# Patient Record
Sex: Female | Born: 1995 | Race: Black or African American | Hispanic: No | Marital: Single | Smoking: Never smoker
Health system: Southern US, Community
[De-identification: ages and names within clinical notes are randomized; demographics above are authoritative.]

## PROBLEM LIST (undated history)

## (undated) ENCOUNTER — Inpatient Hospital Stay (HOSPITAL_COMMUNITY): Payer: Self-pay

## (undated) DIAGNOSIS — D649 Anemia, unspecified: Secondary | ICD-10-CM

## (undated) DIAGNOSIS — J45909 Unspecified asthma, uncomplicated: Secondary | ICD-10-CM

## (undated) DIAGNOSIS — L709 Acne, unspecified: Secondary | ICD-10-CM

## (undated) HISTORY — PX: OTHER SURGICAL HISTORY: SHX169

## (undated) HISTORY — DX: Acne, unspecified: L70.9

---
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
## 2013-04-22 ENCOUNTER — Emergency Department (HOSPITAL_COMMUNITY)
Admission: EM | Admit: 2013-04-22 | Discharge: 2013-04-22 | Discharge status: Against Medical Advice | Payer: No Typology Code available for payment source | Attending: Emergency Medicine | Admitting: Emergency Medicine

## 2013-04-22 ENCOUNTER — Encounter (HOSPITAL_COMMUNITY): Payer: Self-pay | Admitting: Emergency Medicine

## 2013-04-22 DIAGNOSIS — R1084 Generalized abdominal pain: Secondary | ICD-10-CM | POA: Insufficient documentation

## 2013-04-22 DIAGNOSIS — R1110 Vomiting, unspecified: Secondary | ICD-10-CM | POA: Insufficient documentation

## 2013-04-22 DIAGNOSIS — J45909 Unspecified asthma, uncomplicated: Secondary | ICD-10-CM | POA: Insufficient documentation

## 2013-04-22 DIAGNOSIS — Z3202 Encounter for pregnancy test, result negative: Secondary | ICD-10-CM | POA: Insufficient documentation

## 2013-04-22 LAB — COMPREHENSIVE METABOLIC PANEL
ALBUMIN: 3.7 g/dL (ref 3.5–5.2)
ALK PHOS: 77 U/L (ref 39–117)
ALT: 22 U/L (ref 0–35)
AST: 29 U/L (ref 0–37)
BUN: 12 mg/dL (ref 6–23)
CHLORIDE: 102 meq/L (ref 96–112)
CO2: 25 mEq/L (ref 19–32)
Calcium: 9.4 mg/dL (ref 8.4–10.5)
Creatinine, Ser: 0.72 mg/dL (ref 0.50–1.10)
GFR calc Af Amer: >90 mL/min (ref >90)
GFR calc non Af Amer: >90 mL/min (ref >90)
GLUCOSE: 106 mg/dL — AB (ref 70–99)
POTASSIUM: 3.5 meq/L — AB (ref 3.7–5.3)
SODIUM: 140 meq/L (ref 137–147)
TOTAL PROTEIN: 7.3 g/dL (ref 6.0–8.3)
Total Bilirubin: 0.2 mg/dL — ABNORMAL LOW (ref 0.3–1.2)

## 2013-04-22 LAB — URINALYSIS, ROUTINE W REFLEX MICROSCOPIC
BILIRUBIN URINE: NEGATIVE
GLUCOSE, UA: NEGATIVE mg/dL
Ketones, ur: NEGATIVE mg/dL
Nitrite: NEGATIVE
PH: 6.5 (ref 5.0–8.0)
Protein, ur: NEGATIVE mg/dL
SPECIFIC GRAVITY, URINE: 1.026 (ref 1.005–1.030)
Urobilinogen, UA: 1 mg/dL (ref 0.0–1.0)

## 2013-04-22 LAB — CBC WITH DIFFERENTIAL/PLATELET
BASOS PCT: 0 % (ref 0–1)
Basophils Absolute: 0 10*3/uL (ref 0.0–0.1)
EOS ABS: 0.1 10*3/uL (ref 0.0–0.7)
Eosinophils Relative: 2 % (ref 0–5)
HEMATOCRIT: 37.6 % (ref 36.0–46.0)
HEMOGLOBIN: 12.8 g/dL (ref 12.0–15.0)
LYMPHS ABS: 2.7 10*3/uL (ref 0.7–4.0)
Lymphocytes Relative: 47 % — ABNORMAL HIGH (ref 12–46)
MCH: 28 pg (ref 26.0–34.0)
MCHC: 34 g/dL (ref 30.0–36.0)
MCV: 82.3 fL (ref 78.0–100.0)
Monocytes Absolute: 0.4 10*3/uL (ref 0.1–1.0)
Monocytes Relative: 8 % (ref 3–12)
NEUTROS ABS: 2.4 10*3/uL (ref 1.7–7.7)
NEUTROS PCT: 43 % (ref 43–77)
PLATELETS: 338 10*3/uL (ref 150–400)
RBC: 4.57 MIL/uL (ref 3.87–5.11)
RDW: 13 % (ref 11.5–15.5)
WBC: 5.6 10*3/uL (ref 4.0–10.5)

## 2013-04-22 LAB — URINE MICROSCOPIC-ADD ON

## 2013-04-22 LAB — POCT PREGNANCY, URINE: Preg Test, Ur: NEGATIVE

## 2013-04-22 LAB — LIPASE, BLOOD: Lipase: 24 U/L (ref 11–59)

## 2013-04-22 NOTE — ED Notes (Signed)
Pt left AMA °

## 2013-04-22 NOTE — ED Notes (Signed)
Pt states that she started having generalized abdominal pain and emesis 1 1/2 hours ago.

## 2013-07-08 ENCOUNTER — Encounter: Payer: Self-pay | Admitting: Obstetrics & Gynecology

## 2013-07-08 ENCOUNTER — Ambulatory Visit (INDEPENDENT_AMBULATORY_CARE_PROVIDER_SITE_OTHER): Payer: No Typology Code available for payment source | Admitting: Obstetrics & Gynecology

## 2013-07-08 ENCOUNTER — Telehealth: Payer: Self-pay | Admitting: Hematology and Oncology

## 2013-07-08 VITALS — BP 97/67 | HR 78 | Ht 62.0 in | Wt 170.0 lb

## 2013-07-08 DIAGNOSIS — Z975 Presence of (intrauterine) contraceptive device: Secondary | ICD-10-CM

## 2013-07-08 DIAGNOSIS — Z01812 Encounter for preprocedural laboratory examination: Secondary | ICD-10-CM

## 2013-07-08 DIAGNOSIS — Z30430 Encounter for insertion of intrauterine contraceptive device: Secondary | ICD-10-CM

## 2013-07-08 LAB — POCT URINE PREGNANCY: PREG TEST UR: NEGATIVE

## 2013-07-08 NOTE — Telephone Encounter (Signed)
C/D 07/08/13 for appt. 07/14/13

## 2013-07-08 NOTE — Progress Notes (Signed)
   Subjective:    Patient ID: Veronica [name], female    DOB: April 20, 1995, 18 y.o.   MRN: [id number]  HPI  18 yo S AA [id number] ( 728 month old daughter Corky CraftsSemoria) who is here today for contracetion. She is currently using condoms. She used the OCP for 1 month after her pp visit but she was forgetting to take her pills and doesn't want to use OCPs. She would like Mirena. She wants to delay childbearing for about 5-6 years.   Review of Systems     Objective:   Physical Exam  UPT negative, consent signed, Time out procedure done. Cervix prepped with betadine and grasped with a single tooth tenaculum. Mirena was easily placed and the strings were cut to 3-4 cm. Uterus sounded to 9 cm. She tolerated the procedure well.        Assessment & Plan:  Contraception- Mirena RTC 4 weeks for string check

## 2013-07-29 ENCOUNTER — Ambulatory Visit: Payer: No Typology Code available for payment source | Admitting: Obstetrics & Gynecology

## 2013-07-29 DIAGNOSIS — Z30431 Encounter for routine checking of intrauterine contraceptive device: Secondary | ICD-10-CM

## 2013-09-11 ENCOUNTER — Ambulatory Visit: Payer: No Typology Code available for payment source | Admitting: Obstetrics & Gynecology

## 2014-02-16 ENCOUNTER — Encounter: Payer: Self-pay | Admitting: Obstetrics & Gynecology

## 2014-04-24 IMAGING — US US OB DETAIL+14 WK
2 series · 12 of 28 positions shown · non-contrast
Comparison: none

[Series 1: us ob detail +14 wk · 14 acquisitions, 2 frames shown (1 of 2)]
[im 4/14]
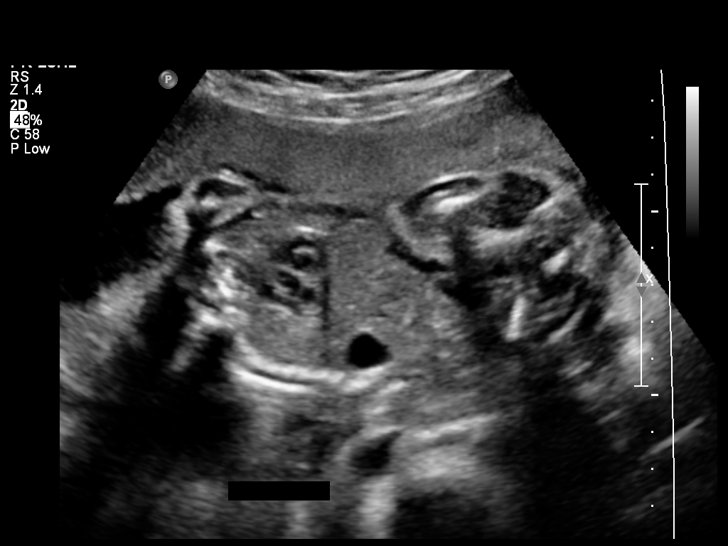
[im 10/14]
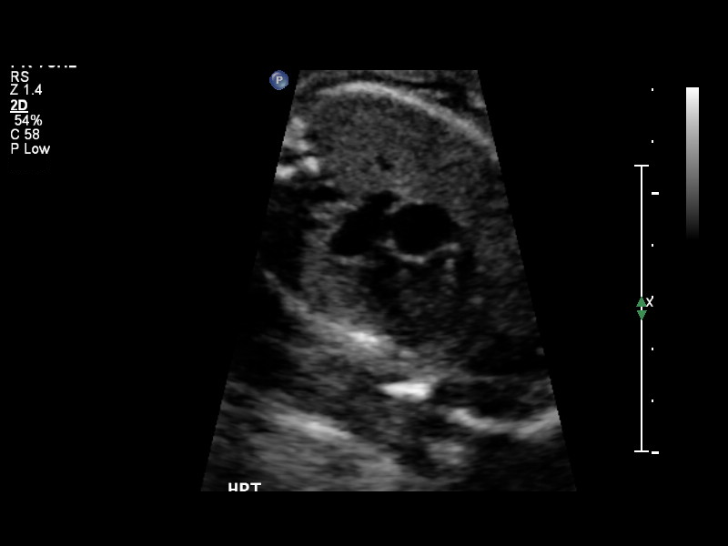

[Series 1: us ob detail +14 wk · 10 of 63 slices shown (2 of 2)]
[im 1/63]
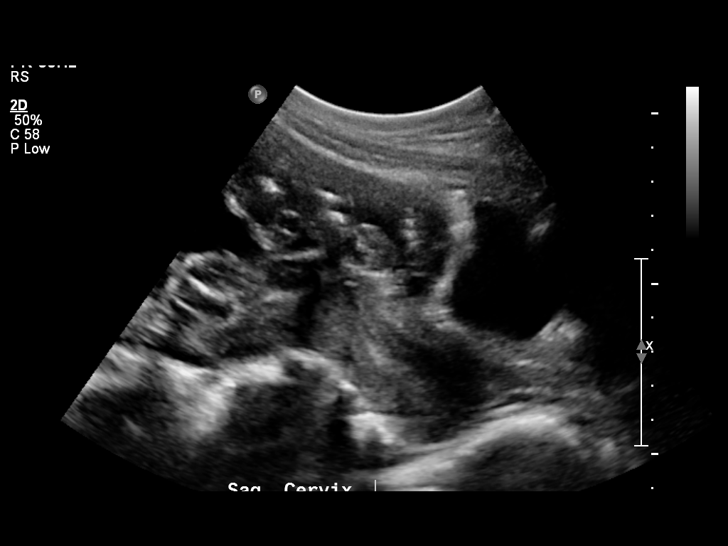
[im 9/63]
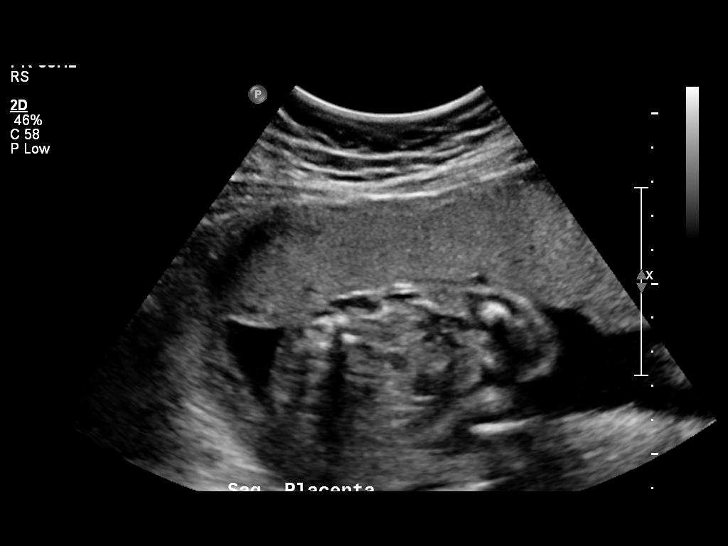
[im 15/63]
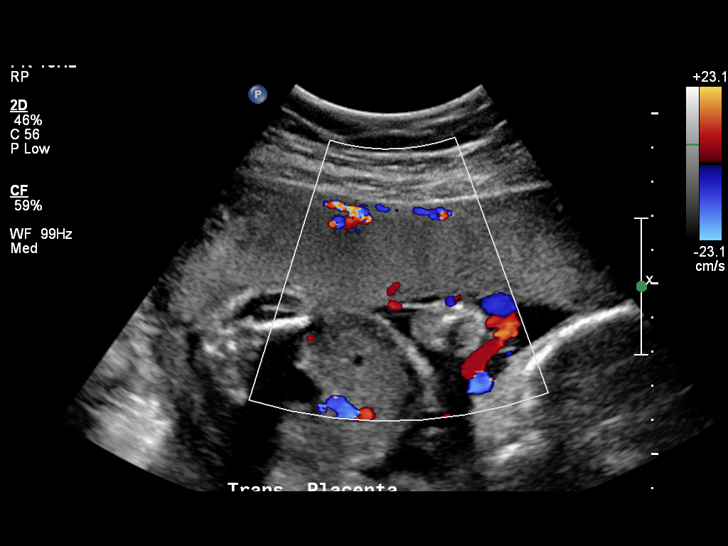
[im 20/63]
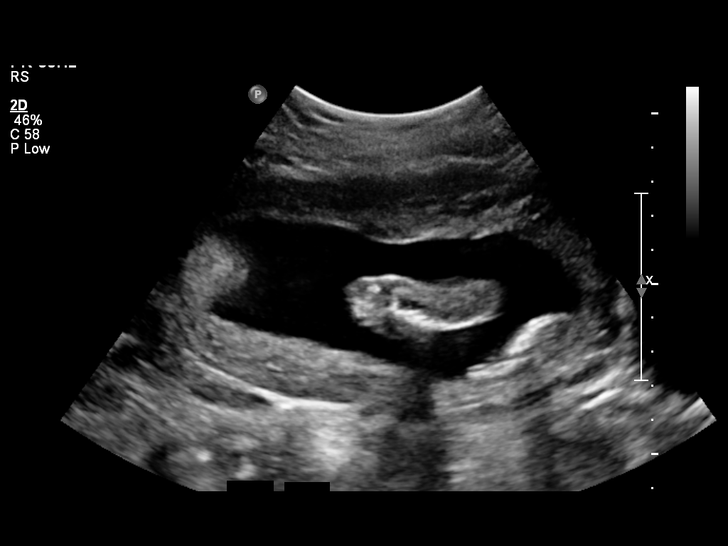
[im 29/63]
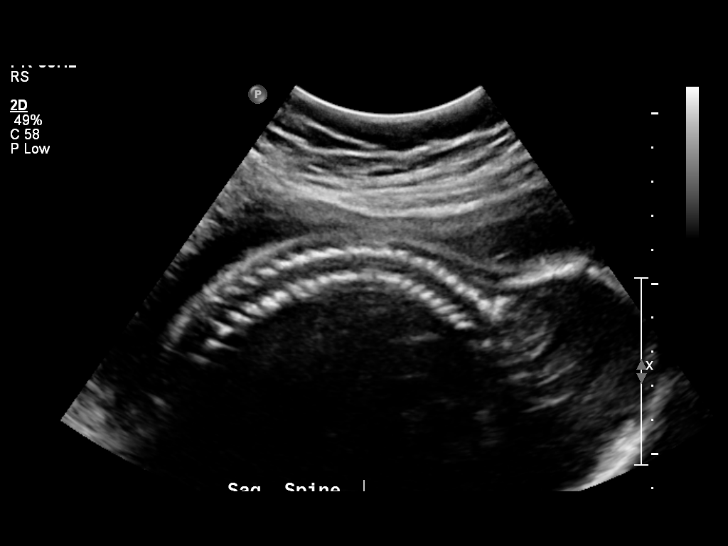
[im 34/63]
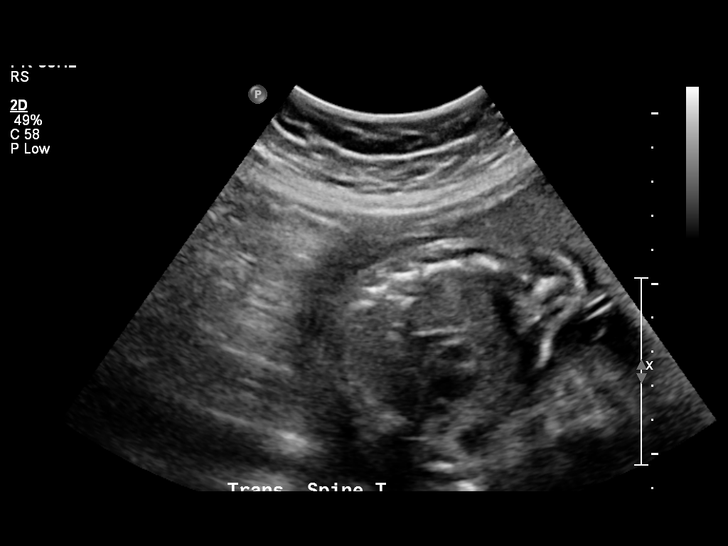
[im 40/63]
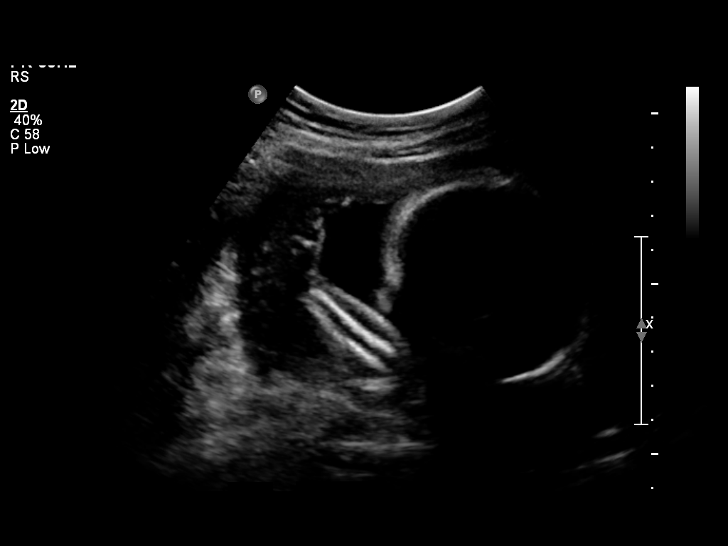
[im 48/63]
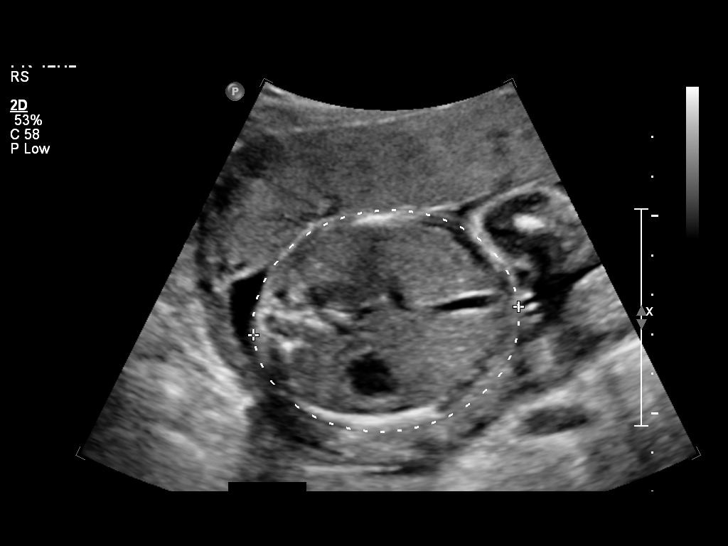
[im 54/63]
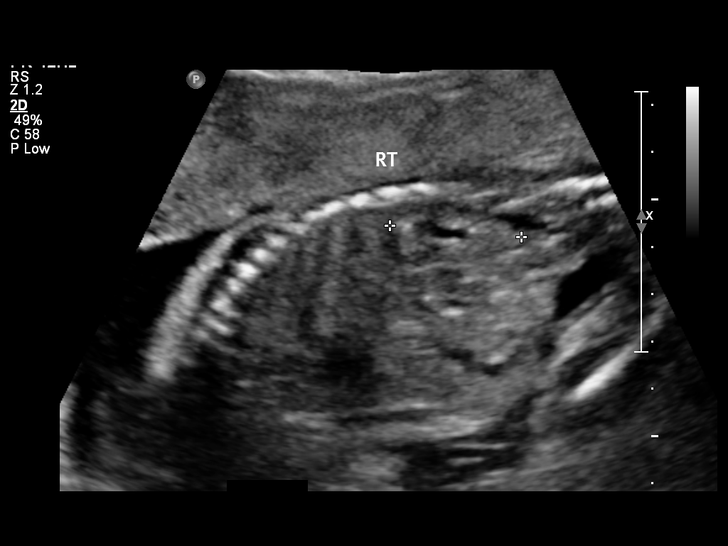
[im 60/63]
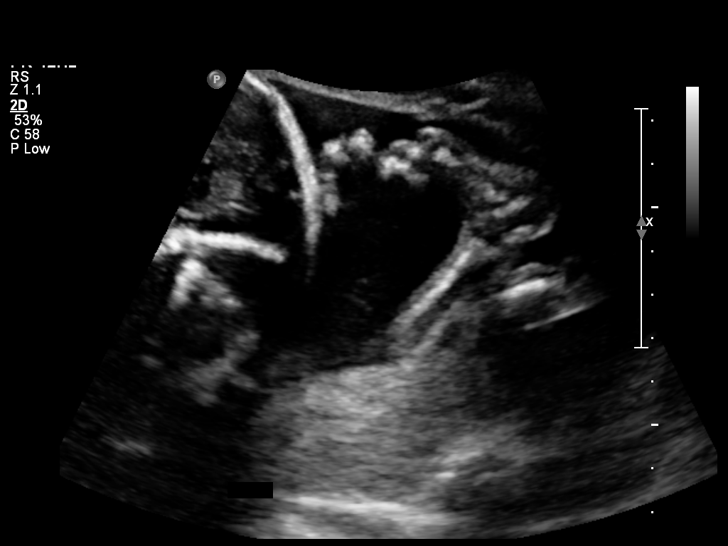

[12 of 28 positions shown; findings below may reference images not displayed]

OBSTETRICS REPORT
                      (Signed Final 07/17/2012 [DATE])

Service(s) Provided

 US OB DETAIL + 14 WK                                  76811.0
Indications

 Detailed fetal anatomic survey
 Unsure of LMP;  Establish Gestational [AGE]
Fetal Evaluation

 Num Of Fetuses:    1
 Fetal Heart Rate:  152                          bpm
 Cardiac Activity:  Observed
 Presentation:      Breech
 Placenta:          Anterior, above cervical os
 P. Cord            Visualized, central
 Insertion:

 Amniotic Fluid
 AFI FV:      Subjectively within normal limits
                                             Larg Pckt:     4.9  cm
Biometry

 BPD:       58  mm     G. Age:  23w 6d                CI:        68.34   70 - 86
                                                      FL/HC:      19.0   18.7 -

 HC:     224.3  mm     G. Age:  24w 3d       44  %    HC/AC:      1.15   1.05 -

 AC:       195  mm     G. Age:  24w 1d       43  %    FL/BPD:     73.6   71 - 87
 FL:      42.7  mm     G. Age:  24w 0d       32  %    FL/AC:      21.9   20 - 24
 HUM:     39.5  mm     G. Age:  24w 1d       41  %

 Est. FW:     661  gm      1 lb 7 oz     51  %
Gestational Age

 U/S Today:     24w 1d                                        EDD:   11/05/12
 Best:          24w 1d     Det. By:  U/S (07/17/12)           EDD:   11/05/12
2nd Trimester Genetic Sonogram - Trisomy 21 Screening

 Age:                                             17          Risk=1:   885

 Structural anomalies (inc. cardiac):             No
 Echogenic bowel:                                 No
 Hypoplastic / absent midphalanx 5th Digit:       No
 Pyelectasis:                                     No
 2-vessel umbilical cord:                         No
 Echogenic cardiac foci:                          No
Anatomy

 Cranium:          Appears normal         Aortic Arch:      Appears normal
 Fetal Cavum:      Appears normal         Ductal Arch:      Not well visualized
 Ventricles:       Appears normal         Diaphragm:        Appears normal
 Choroid Plexus:   Appears normal         Stomach:          Appears normal, left
                                                            sided
 Cerebellum:       Appears normal         Abdomen:          Appears normal
 Posterior Fossa:  Appears normal         Abdominal Wall:   Appears nml (cord
                                                            insert, abd wall)
 Nuchal Fold:      Not applicable (>20    Cord Vessels:     Appears normal (3
                   wks GA)                                  vessel cord)
 Face:             Appears normal         Kidneys:          Appear normal
                   (orbits and profile)
 Lips:             Appears normal         Bladder:          Appears normal
 Heart:            Appears normal         Spine:            Appears normal
                   (4CH, axis, and
                   situs)
 RVOT:             Not well visualized    Lower             Appears normal
                                          Extremities:
 LVOT:             Not well visualized    Upper             Appears normal
                                          Extremities:

 Other:  Female gender. Heels and 5th digit visualized.  Technically difficult
         due to fetal position.
Cervix Uterus Adnexa

 Cervical Length:    3.4      cm

 Cervix:       Normal appearance by transabdominal scan.
 Uterus:       No abnormality visualized.

 Left Ovary:    No adnexal mass visualized.
 Right Ovary:   No adnexal mass visualized.
 Adnexa:     No abnormality visualized.
Impression

 Single living IUP with US Gest. Age of 24w 1d, and EDD of
 11/05/2012.
 No fetal anomalies seen involving visualized anatomy.
 No sonographic markers for aneuploidy visualized.

## 2015-06-15 ENCOUNTER — Ambulatory Visit: Payer: No Typology Code available for payment source | Admitting: Family Medicine

## 2016-01-07 ENCOUNTER — Emergency Department (HOSPITAL_COMMUNITY)
Admission: EM | Admit: 2016-01-07 | Discharge: 2016-01-07 | Disposition: A | Discharge status: Home / Self Care | Payer: Medicaid Other | Attending: Emergency Medicine | Admitting: Emergency Medicine

## 2016-01-07 ENCOUNTER — Encounter (HOSPITAL_COMMUNITY): Payer: Self-pay

## 2016-01-07 ENCOUNTER — Emergency Department (HOSPITAL_COMMUNITY): Payer: Medicaid Other

## 2016-01-07 DIAGNOSIS — K2100 Gastro-esophageal reflux disease with esophagitis, without bleeding: Secondary | ICD-10-CM

## 2016-01-07 DIAGNOSIS — J45909 Unspecified asthma, uncomplicated: Secondary | ICD-10-CM | POA: Diagnosis not present

## 2016-01-07 DIAGNOSIS — K8020 Calculus of gallbladder without cholecystitis without obstruction: Secondary | ICD-10-CM | POA: Insufficient documentation

## 2016-01-07 DIAGNOSIS — R1013 Epigastric pain: Secondary | ICD-10-CM | POA: Diagnosis present

## 2016-01-07 DIAGNOSIS — R1011 Right upper quadrant pain: Secondary | ICD-10-CM

## 2016-01-07 LAB — LIPASE, BLOOD: LIPASE: 19 U/L (ref 11–51)

## 2016-01-07 LAB — CBC
HEMATOCRIT: 40.7 % (ref 36.0–46.0)
HEMOGLOBIN: 13.5 g/dL (ref 12.0–15.0)
MCH: 28.3 pg (ref 26.0–34.0)
MCHC: 33.2 g/dL (ref 30.0–36.0)
MCV: 85.3 fL (ref 78.0–100.0)
Platelets: 373 10*3/uL (ref 150–400)
RBC: 4.77 MIL/uL (ref 3.87–5.11)
RDW: 13.1 % (ref 11.5–15.5)
WBC: 10 10*3/uL (ref 4.0–10.5)

## 2016-01-07 LAB — URINALYSIS, ROUTINE W REFLEX MICROSCOPIC
Bilirubin Urine: NEGATIVE
GLUCOSE, UA: NEGATIVE mg/dL
Hgb urine dipstick: NEGATIVE
Ketones, ur: NEGATIVE mg/dL
LEUKOCYTES UA: NEGATIVE
NITRITE: NEGATIVE
PH: 8 (ref 5.0–8.0)
Protein, ur: NEGATIVE mg/dL
SPECIFIC GRAVITY, URINE: 1.02 (ref 1.005–1.030)

## 2016-01-07 LAB — COMPREHENSIVE METABOLIC PANEL
ALBUMIN: 4.5 g/dL (ref 3.5–5.0)
ALT: 84 U/L — ABNORMAL HIGH (ref 14–54)
ANION GAP: 9 (ref 5–15)
AST: 185 U/L — AB (ref 15–41)
Alkaline Phosphatase: 69 U/L (ref 38–126)
BUN: 7 mg/dL (ref 6–20)
CHLORIDE: 104 mmol/L (ref 101–111)
CO2: 26 mmol/L (ref 22–32)
Calcium: 9.5 mg/dL (ref 8.9–10.3)
Creatinine, Ser: 0.69 mg/dL (ref 0.44–1.00)
GFR calc Af Amer: >60 mL/min (ref >60)
Glucose, Bld: 101 mg/dL — ABNORMAL HIGH (ref 65–99)
POTASSIUM: 3.5 mmol/L (ref 3.5–5.1)
Sodium: 139 mmol/L (ref 135–145)
Total Bilirubin: 0.9 mg/dL (ref 0.3–1.2)
Total Protein: 7.8 g/dL (ref 6.5–8.1)

## 2016-01-07 LAB — I-STAT BETA HCG BLOOD, ED (MC, WL, AP ONLY): I-stat hCG, quantitative: <5 m[IU]/mL (ref <5)

## 2016-01-07 MED ORDER — GI COCKTAIL ~~LOC~~
30.0000 mL | Freq: Once | ORAL | Status: AC
  Administered 2016-01-07: 30 mL via ORAL
  Filled 2016-01-07: qty 30

## 2016-01-07 MED ORDER — OMEPRAZOLE 20 MG PO CPDR: 20.0000 mg | DELAYED_RELEASE_CAPSULE | Freq: Every day | ORAL | 0 refills | Status: DC

## 2016-01-07 NOTE — ED Notes (Signed)
Pt c/o abdominal pain, vomiting started at 2 am this morning, reports that she believes she has undiagnosed stomach ulcers that flare up from time to time, has a history of "reflux" states she took 2 doses of Zantac this am and has had no relief of her symptoms.

## 2016-01-07 NOTE — ED Triage Notes (Signed)
Pt with emesis and abdominal pain starting today.  Pt believes she has ulcers although she has never been diagnosed.  Pain is in left upper abdomen.  Pt denies diarrhea or fever.

## 2016-01-07 NOTE — ED Notes (Signed)
Verbalized understanding discharge instructions, prescription, and follow-up. In no acute distress.   

## 2016-01-08 NOTE — ED Provider Notes (Signed)
[facility]-EMERGENCY DEPT Provider Note   CSN: [id number] Arrival date & time: 01/07/16  1030     History   Chief Complaint Chief Complaint  Patient presents with  . Abdominal Pain  . Emesis    HPI Veronica [name] is a 20 y.o. female.  HPI 20 yo F who p/w epigastric and RUQ abdominal pain. Pt states that for the past year, she has had intermittent epigastric burning pain and RUQ pain with eating. Her current episode started three days ago after eating a spicy, fatty meal. She reports the pain as an epigastric, burning pain that is worse with lying flat and after eating spicy foods. She has minimal, intermittent RUQ pain that was present yesterday btu now resolved and she is tolerating PO. Has not taken any meds. She has a strong family h/o GERD and also drinks EtOH. No NSAID use or ASA use. No bloody emesis or melena. No fevers or chills.  Past Medical History:  Diagnosis Date  . Asthma    Childhood    Patient Active Problem List   Diagnosis Date Noted  . S/P cesarean section 11/10/2012  . Supervision of normal first pregnancy 09/04/2012  . BV (bacterial vaginosis) 07/12/2012  . Asthma complicating pregnancy, antepartum 07/11/2012    Past Surgical History:  Procedure Laterality Date  . CESAREAN SECTION N/A 11/10/2012   Procedure: Primary cesarean section with delivery of baby girl at 0731. Apgars 8/9.;  Surgeon: Tereso Newcomer, MD;  Location: [facility] ORS;  Service: Obstetrics;  Laterality: N/A;  . head surgery     Top of head epidermal but put to sleep    OB History    Gravida Para Term Preterm AB Living   1 1 1     1    SAB TAB Ectopic Multiple Live Births           1       Home Medications    Prior to Admission medications   Medication Sig Start Date End Date Taking? Authorizing Provider  albuterol (PROAIR HFA) 108 (90 Base) MCG/ACT inhaler Inhale 2 puffs into the lungs every 4 (four) hours as needed for wheezing or shortness of breath.   Yes Historical Provider, MD    ranitidine (ZANTAC) 150 MG tablet Take 150 mg by mouth 2 (two) times daily as needed for heartburn.    Yes Historical Provider, MD  omeprazole (PRILOSEC) 20 MG capsule Take 1 capsule (20 mg total) by mouth daily. In the morning, 30 minutes before food 01/07/16   Shaune Pollack, MD    Family History Family History  Problem Relation Age of Onset  . Hyperlipidemia Maternal Grandmother   . Diabetes Paternal Grandmother     Social History Social History  Substance Use Topics  . Smoking status: Never Smoker  . Smokeless tobacco: Never Used  . Alcohol use No     Allergies   Sulfa antibiotics   Review of Systems Review of Systems  Constitutional: Negative for chills and fever.  HENT: Negative for congestion, rhinorrhea and sore throat.   Eyes: Negative for visual disturbance.  Respiratory: Negative for cough, shortness of breath and wheezing.   Cardiovascular: Negative for chest pain and leg swelling.  Gastrointestinal: Positive for abdominal pain and nausea. Negative for diarrhea and vomiting.  Genitourinary: Negative for dysuria, flank pain, vaginal bleeding and vaginal discharge.  Musculoskeletal: Negative for neck pain.  Skin: Negative for rash.  Allergic/Immunologic: Negative for immunocompromised state.  Neurological: Negative for syncope and headaches.  Hematological:  Does not bruise/bleed easily.  All other systems reviewed and are negative.    Physical Exam Updated Vital Signs BP 114/71 (BP Location: Right Arm)   Pulse 66   Temp 98.8 F (37.1 C) (Oral)   Resp 16   SpO2 100%   Physical Exam  Constitutional: She is oriented to person, place, and time. She appears well-developed and well-nourished. No distress.  HENT:  Head: Normocephalic and atraumatic.  Eyes: Conjunctivae are normal.  Neck: Neck supple.  Cardiovascular: Normal rate, regular rhythm and normal heart sounds.  Exam reveals no friction rub.   No murmur heard. Pulmonary/Chest: Effort normal and  breath sounds normal. No respiratory distress. She has no wheezes. She has no rales.  Abdominal: Soft. Normal appearance and bowel sounds are normal. She exhibits no distension. There is tenderness (minimal) in the epigastric area. There is no rigidity, no rebound, no guarding, no CVA tenderness, no tenderness at McBurney's point and negative Murphy's sign.  Musculoskeletal: She exhibits no edema.  Neurological: She is alert and oriented to person, place, and time. She exhibits normal muscle tone.  Skin: Skin is warm. Capillary refill takes less than 2 seconds.  Psychiatric: She has a normal mood and affect.  Nursing note and vitals reviewed.    ED Treatments / Results  Labs (all labs ordered are listed, but only abnormal results are displayed) Labs Reviewed  COMPREHENSIVE METABOLIC PANEL - Abnormal; Notable for the following:       Result Value   Glucose, Bld 101 (*)    AST 185 (*)    ALT 84 (*)    All other components within normal limits  LIPASE, BLOOD  CBC  URINALYSIS, ROUTINE W REFLEX MICROSCOPIC (NOT AT [facility])  I-STAT BETA HCG BLOOD, ED ([facility], [facility], [facility] ONLY)    EKG  EKG Interpretation None       Radiology US Abdomen Limited Ruq  Result Date: 01/07/2016 CLINICAL DATA:  Right upper quadrant pain EXAM: US ABDOMEN LIMITED - RIGHT UPPER QUADRANT COMPARISON:  None. FINDINGS: Gallbladder: Multiple gallstones identified measuring up to 6 mm. No gallbladder wall thickness. No evidence for pericholecystic fluid. The sonographer reports no sonographic Murphy sign. Common bile duct: Diameter: Nondilated at 3-4 mm. Liver: No focal abnormality identified. Liver parenchyma appears slightly echogenic, raising the question of fatty deposition. IMPRESSION: Cholelithiasis without gallbladder wall thickening or pericholecystic fluid. Electronically Signed   By: Kennith Center M.D.   On: 01/07/2016 13:29    Procedures Procedures (including critical care time)  Medications Ordered in  ED Medications  gi cocktail (Maalox,Lidocaine,Donnatal) (30 mLs Oral Given 01/07/16 1232)     Initial Impression / Assessment and Plan / ED Course  I have reviewed the triage vital signs and the nursing notes.  Pertinent labs & imaging results that were available during my care of the patient were reviewed by me and considered in my medical decision making (see chart for details).  Clinical Course   20 yo F with no significant PMHx who p/w epigastric, burning abdominal pain worse with lying flat and improves with sitting upright. On arrival, VSS and WNL. Pain resolved with GI cocktail. Suspect GERD/gastritis with classic history. Biliary colic also on DDx though she has no RUQ pain at this time, tolerating PO, and no RUQ TTP, Murphy's fever, or signs of cholecystitis/impacted stone at this time - will check U/S, labs.  Labs overall are unremarkable. CBC shows no leukocytosis. CMP does show mild AST/ALT elevation but normal bilirubin - DDx fatty liver  disease, EtOH given 2:1 ratio. Normal bili, normal WBC, and U/S shows colic but no cholecystitis - doubt cholecystitis at this time and pain resolved after GI cocktail. Otherwise, Hgb wnl and doubt bleeding ulcer. Denies ASA, NSAID, as well as Tylenol use (given LFTs).  Discussed labs, findings with pt. Suspect GERD with possible symptomatic biliary colic. Will refer for surgery follow-up, trial NSAIDs. Discussed symptoms of cholecystitis with pt and encouraged return if sx worsen, persistent, or localize to RUQ. Pt ina greement. Will d/c home. Tolerating PO without pain at this time.  Final Clinical Impressions(s) / ED Diagnoses   Final diagnoses:  RUQ pain  Calculus of gallbladder without cholecystitis without obstruction  Gastroesophageal reflux disease with esophagitis    New Prescriptions Discharge Medication List as of 01/07/2016  1:42 PM    START taking these medications   Details  omeprazole (PRILOSEC) 20 MG capsule Take 1 capsule  (20 mg total) by mouth daily. In the morning, 30 minutes before food, Starting Fri 01/07/2016, Print         Shaune Pollackameron Lala Been, MD 01/08/16 1217

## 2016-12-26 ENCOUNTER — Ambulatory Visit: Payer: Self-pay | Admitting: Women's Health

## 2017-01-01 ENCOUNTER — Ambulatory Visit: Payer: Self-pay | Admitting: Women's Health

## 2017-01-16 ENCOUNTER — Ambulatory Visit: Payer: Self-pay | Admitting: Women's Health

## 2017-04-23 DIAGNOSIS — L02214 Cutaneous abscess of groin: Secondary | ICD-10-CM | POA: Diagnosis not present

## 2017-06-22 ENCOUNTER — Encounter: Payer: Self-pay | Admitting: Obstetrics & Gynecology

## 2017-06-22 ENCOUNTER — Ambulatory Visit (INDEPENDENT_AMBULATORY_CARE_PROVIDER_SITE_OTHER): Payer: Medicaid Other | Admitting: Obstetrics & Gynecology

## 2017-06-22 ENCOUNTER — Other Ambulatory Visit (HOSPITAL_COMMUNITY)
Admission: RE | Admit: 2017-06-22 | Discharge: 2017-06-22 | Disposition: A | Discharge status: Home / Self Care | Payer: Medicaid Other | Source: Ambulatory Visit | Attending: Obstetrics & Gynecology | Admitting: Obstetrics & Gynecology

## 2017-06-22 VITALS — BP 137/69 | HR 79 | Ht 62.0 in | Wt 167.0 lb

## 2017-06-22 DIAGNOSIS — Z23 Encounter for immunization: Secondary | ICD-10-CM

## 2017-06-22 DIAGNOSIS — Z0000 Encounter for general adult medical examination without abnormal findings: Secondary | ICD-10-CM

## 2017-06-22 DIAGNOSIS — Z01419 Encounter for gynecological examination (general) (routine) without abnormal findings: Secondary | ICD-10-CM | POA: Insufficient documentation

## 2017-06-22 NOTE — Progress Notes (Signed)
Subjective:    Veronica [name] is a 22 y.o. single G1 (24 yo daughter)female who presents for an annual exam. The patient is sexually active. GYN screening history: no prior history of gyn screening tests. The patient wears seatbelts: yes. The patient participates in regular exercise: yes. Has the patient ever been transfused or tattooed?: yes. The patient reports that there is not domestic violence in her life.   Menstrual History: OB History    Gravida Para Term Preterm AB Living   1 1 1     1    SAB TAB Ectopic Multiple Live Births           1      Menarche age: 22 No LMP recorded. Patient is not currently having periods (Reason: IUD).    The following portions of the patient's history were reviewed and updated as appropriate: allergies, current medications, past family history, past medical history, past social history, past surgical history and problem list.  Review of Systems Pertinent items are noted in HPI.   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"    Objective:    BP 137/69   Pulse 79   Ht 5\' 2"  (1.575 m)   Wt 167 lb (75.8 kg)   BMI 30.54 kg/m   General Appearance:    Alert, cooperative, no distress, appears stated age  Head:    Normocephalic, without obvious abnormality, atraumatic  Eyes:    PERRL, conjunctiva/corneas clear, EOM's intact, fundi    benign, both eyes  Ears:    Normal TM's and external ear canals, both ears  Nose:   Nares normal, septum midline, mucosa normal, no drainage    or sinus tenderness  Throat:   Lips, mucosa, and tongue normal; teeth and gums normal  Neck:   Supple, symmetrical, trachea midline, no adenopathy;    thyroid:  no enlargement/tenderness/nodules; no carotid   bruit or JVD  Back:     Symmetric, no curvature, ROM normal, no CVA tenderness  Lungs:     Clear to auscultation bilaterally, respirations unlabored  Chest Wall:    No tenderness  or deformity   Heart:    Regular rate and rhythm, S1 and S2 normal, no murmur, rub   or gallop  Breast Exam:    No tenderness, masses, or nipple abnormality, pierced nipples  Abdomen:     Soft, non-tender, bowel sounds active all four quadrants,    no masses, no organomegaly  Genitalia:    Normal female without lesion, discharge or tenderness, normal size and shape, anteverted, mobile, non-tender, normal adnexal exam      Extremities:   Extremities normal, atraumatic, no cyanosis or edema  Pulses:   2+ and symmetric all extremities  Skin:   Skin color, texture, turgor normal, no rashes or lesions  Lymph nodes:   Cervical, supraclavicular, and axillary nodes normal  Neurologic:   CNII-XII intact, normal strength, sensation and reflexes    throughout  .    Assessment:    Healthy female exam.    Plan:     Thin prep Pap smear.   STI testing Start Gardasil Refer to fam med

## 2017-06-22 NOTE — Progress Notes (Signed)
Pt desires IUD removal d/t it being in for 5 years. Pt had STI screening last month - does not need today.

## 2017-06-25 LAB — CYTOLOGY - PAP: Diagnosis: NEGATIVE

## 2017-08-22 ENCOUNTER — Ambulatory Visit: Payer: Medicaid Other

## 2017-08-24 ENCOUNTER — Ambulatory Visit (INDEPENDENT_AMBULATORY_CARE_PROVIDER_SITE_OTHER): Payer: Medicaid Other

## 2017-08-24 DIAGNOSIS — Z23 Encounter for immunization: Secondary | ICD-10-CM

## 2017-08-24 NOTE — Progress Notes (Signed)
Pt here for HPV inj. Inj given in left deltoid. Pt tolerated well. Pt advised to schedule appt for her last inj for 4 months from now.

## 2017-08-27 NOTE — Progress Notes (Signed)
I have reviewed the chart and agree with nursing staff's documentation of this patient's encounter.  Darah Simkin, MD 08/27/2017 2:02 PM    

## 2017-11-15 DIAGNOSIS — Z113 Encounter for screening for infections with a predominantly sexual mode of transmission: Secondary | ICD-10-CM | POA: Diagnosis not present

## 2017-11-15 DIAGNOSIS — N760 Acute vaginitis: Secondary | ICD-10-CM | POA: Diagnosis not present

## 2017-11-22 DIAGNOSIS — W57XXXA Bitten or stung by nonvenomous insect and other nonvenomous arthropods, initial encounter: Secondary | ICD-10-CM | POA: Diagnosis not present

## 2017-11-22 DIAGNOSIS — L0390 Cellulitis, unspecified: Secondary | ICD-10-CM | POA: Diagnosis not present

## 2018-01-02 DIAGNOSIS — Z23 Encounter for immunization: Secondary | ICD-10-CM | POA: Diagnosis not present

## 2018-02-15 ENCOUNTER — Emergency Department (HOSPITAL_COMMUNITY): Payer: Medicaid Other

## 2018-02-15 ENCOUNTER — Emergency Department (HOSPITAL_COMMUNITY)
Admission: EM | Admit: 2018-02-15 | Discharge: 2018-02-15 | Disposition: A | Discharge status: Home / Self Care | Payer: Medicaid Other | Attending: Emergency Medicine | Admitting: Emergency Medicine

## 2018-02-15 ENCOUNTER — Other Ambulatory Visit: Payer: Self-pay

## 2018-02-15 ENCOUNTER — Encounter (HOSPITAL_COMMUNITY): Payer: Self-pay | Admitting: Emergency Medicine

## 2018-02-15 DIAGNOSIS — R6883 Chills (without fever): Secondary | ICD-10-CM | POA: Diagnosis not present

## 2018-02-15 DIAGNOSIS — R55 Syncope and collapse: Secondary | ICD-10-CM | POA: Diagnosis not present

## 2018-02-15 DIAGNOSIS — K603 Anal fistula: Secondary | ICD-10-CM

## 2018-02-15 DIAGNOSIS — R110 Nausea: Secondary | ICD-10-CM | POA: Diagnosis not present

## 2018-02-15 DIAGNOSIS — K625 Hemorrhage of anus and rectum: Secondary | ICD-10-CM | POA: Diagnosis present

## 2018-02-15 HISTORY — DX: Anemia, unspecified: D64.9

## 2018-02-15 LAB — I-STAT CHEM 8, ED
BUN: 6 mg/dL (ref 6–20)
CALCIUM ION: 1.2 mmol/L (ref 1.15–1.40)
Chloride: 104 mmol/L (ref 98–111)
Creatinine, Ser: 0.7 mg/dL (ref 0.44–1.00)
GLUCOSE: 90 mg/dL (ref 70–99)
HCT: 41 % (ref 36.0–46.0)
HEMOGLOBIN: 13.9 g/dL (ref 12.0–15.0)
Potassium: 3.2 mmol/L — ABNORMAL LOW (ref 3.5–5.1)
SODIUM: 140 mmol/L (ref 135–145)
TCO2: 24 mmol/L (ref 22–32)

## 2018-02-15 LAB — BASIC METABOLIC PANEL
Anion gap: 9 (ref 5–15)
BUN: 8 mg/dL (ref 6–20)
CALCIUM: 9.3 mg/dL (ref 8.9–10.3)
CHLORIDE: 104 mmol/L (ref 98–111)
CO2: 24 mmol/L (ref 22–32)
CREATININE: 0.71 mg/dL (ref 0.44–1.00)
GFR calc Af Amer: >60 mL/min (ref >60)
GFR calc non Af Amer: >60 mL/min (ref >60)
Glucose, Bld: 92 mg/dL (ref 70–99)
Potassium: 3.1 mmol/L — ABNORMAL LOW (ref 3.5–5.1)
SODIUM: 137 mmol/L (ref 135–145)

## 2018-02-15 LAB — CBC WITH DIFFERENTIAL/PLATELET
Abs Immature Granulocytes: 0.06 10*3/uL (ref 0.00–0.07)
Basophils Absolute: 0 10*3/uL (ref 0.0–0.1)
Basophils Relative: 0 %
EOS ABS: 0 10*3/uL (ref 0.0–0.5)
EOS PCT: 0 %
HCT: 39.4 % (ref 36.0–46.0)
HEMOGLOBIN: 12.5 g/dL (ref 12.0–15.0)
Immature Granulocytes: 1 %
Lymphocytes Relative: 9 %
Lymphs Abs: 1.2 10*3/uL (ref 0.7–4.0)
MCH: 28.2 pg (ref 26.0–34.0)
MCHC: 31.7 g/dL (ref 30.0–36.0)
MCV: 88.7 fL (ref 80.0–100.0)
MONO ABS: 0.8 10*3/uL (ref 0.1–1.0)
Monocytes Relative: 6 %
NRBC: 0 % (ref 0.0–0.2)
Neutro Abs: 11 10*3/uL — ABNORMAL HIGH (ref 1.7–7.7)
Neutrophils Relative %: 84 %
Platelets: 338 10*3/uL (ref 150–400)
RBC: 4.44 MIL/uL (ref 3.87–5.11)
RDW: 13.2 % (ref 11.5–15.5)
WBC: 13.2 10*3/uL — AB (ref 4.0–10.5)

## 2018-02-15 LAB — TYPE AND SCREEN
ABO/RH(D): A POS
ANTIBODY SCREEN: NEGATIVE

## 2018-02-15 LAB — ABO/RH: ABO/RH(D): A POS

## 2018-02-15 LAB — I-STAT BETA HCG BLOOD, ED (MC, WL, AP ONLY)

## 2018-02-15 LAB — POC OCCULT BLOOD, ED: FECAL OCCULT BLD: POSITIVE — AB

## 2018-02-15 MED ORDER — CLINDAMYCIN HCL 300 MG PO CAPS
300.0000 mg | ORAL_CAPSULE | Freq: Once | ORAL | Status: AC
  Administered 2018-02-15: 300 mg via ORAL
  Filled 2018-02-15: qty 1

## 2018-02-15 MED ORDER — LIDOCAINE-EPINEPHRINE (PF) 2 %-1:200000 IJ SOLN
INTRAMUSCULAR | Status: AC
  Administered 2018-02-15: 20 mL via INTRADERMAL
  Filled 2018-02-15: qty 20

## 2018-02-15 MED ORDER — SODIUM CHLORIDE 0.9 % IJ SOLN
INTRAMUSCULAR | Status: AC
  Filled 2018-02-15: qty 50

## 2018-02-15 MED ORDER — HYDROCODONE-ACETAMINOPHEN 5-325 MG PO TABS: 2.0000 | ORAL_TABLET | Freq: Four times a day (QID) | ORAL | 0 refills | Status: DC | PRN

## 2018-02-15 MED ORDER — IOPAMIDOL (ISOVUE-300) INJECTION 61%
100.0000 mL | Freq: Once | INTRAVENOUS | Status: AC | PRN
  Administered 2018-02-15: 100 mL via INTRAVENOUS

## 2018-02-15 MED ORDER — FENTANYL CITRATE (PF) 100 MCG/2ML IJ SOLN: 50.0000 ug | Freq: Once | INTRAMUSCULAR | Status: DC

## 2018-02-15 MED ORDER — IOPAMIDOL (ISOVUE-300) INJECTION 61%
INTRAVENOUS | Status: AC
  Filled 2018-02-15: qty 100

## 2018-02-15 MED ORDER — ONDANSETRON 4 MG PO TBDP: 4.0000 mg | ORAL_TABLET | Freq: Four times a day (QID) | ORAL | 0 refills | Status: DC | PRN

## 2018-02-15 MED ORDER — PROBIOTIC PO CAPS: 1.0000 | ORAL_CAPSULE | Freq: Every day | ORAL | 1 refills | Status: DC

## 2018-02-15 MED ORDER — CLINDAMYCIN HCL 300 MG PO CAPS: 300.0000 mg | ORAL_CAPSULE | Freq: Three times a day (TID) | ORAL | 0 refills | Status: DC

## 2018-02-15 MED ORDER — FENTANYL CITRATE (PF) 100 MCG/2ML IJ SOLN
50.0000 ug | Freq: Once | INTRAMUSCULAR | Status: AC
  Administered 2018-02-15: 50 ug via INTRAVENOUS
  Filled 2018-02-15: qty 2

## 2018-02-15 MED ORDER — LIDOCAINE-EPINEPHRINE (PF) 2 %-1:200000 IJ SOLN
20.0000 mL | Freq: Once | INTRAMUSCULAR | Status: AC
  Administered 2018-02-15: 20 mL via INTRADERMAL

## 2018-02-15 MED ORDER — ONDANSETRON HCL 4 MG/2ML IJ SOLN
4.0000 mg | Freq: Once | INTRAMUSCULAR | Status: AC
  Administered 2018-02-15: 4 mg via INTRAVENOUS
  Filled 2018-02-15: qty 2

## 2018-02-15 NOTE — ED Provider Notes (Addendum)
TIME SEEN: 4:50 AM  CHIEF COMPLAINT: Rectal pain  HPI: Patient is a 22 year old female with history of anemia who presents to the emergency department with rectal pain that started tonight.  States she went to the bathroom and noticed purulent, bloody drainage.  States she started feeling very poorly and went to take a shower and had a near syncopal event.  She did strike her head on the shower door.  No loss of consciousness.  Did not fall to the ground.  Thinks that she may have a hemorrhoid.  She denies any known fevers but has had some chills and nausea.  No vomiting or diarrhea.  No chest pain or shortness of breath.  ROS: See HPI Constitutional: no fever  Eyes: no drainage  ENT: no runny nose   Cardiovascular:  no chest pain  Resp: no SOB  GI: no vomiting GU: no dysuria Integumentary: no rash  Allergy: no hives  Musculoskeletal: no leg swelling  Neurological: no slurred speech ROS otherwise negative  PAST MEDICAL HISTORY/PAST SURGICAL HISTORY:  Past Medical History:  Diagnosis Date  . Acne   . Anemia   . Asthma    Childhood    MEDICATIONS:  Prior to Admission medications   Medication Sig Start Date End Date Taking? Authorizing Provider  docusate sodium (COLACE) 100 MG capsule Take 200 mg by mouth daily.   Yes [provider]  ibuprofen (ADVIL,MOTRIN) 800 MG tablet Take 400 mg by mouth every 8 (eight) hours as needed for moderate pain.   Yes [provider]  levonorgestrel (MIRENA) 20 MCG/24HR IUD 1 each by Intrauterine route once.   Yes [provider]  phenylephrine-shark liver oil-mineral oil-petrolatum (PREPARATION H) 0.25-3-14-71.9 % rectal ointment Place 1 application rectally 2 (two) times daily as needed for hemorrhoids.   Yes [provider]    ALLERGIES:  Allergies  Allergen Reactions  . Sulfa Antibiotics Other (See Comments)    Jitters & shakes    SOCIAL HISTORY:  Social History   Tobacco Use  . Smoking status: Never  Smoker  . Smokeless tobacco: Never Used  Substance Use Topics  . Alcohol use: Yes    Comment: Socially    FAMILY HISTORY: Family History  Problem Relation Age of Onset  . Hyperlipidemia Maternal Grandmother   . Hypertension Maternal Grandmother   . Diabetes Paternal Grandmother   . Hypertension Mother   . Lung cancer Paternal Grandfather   . Cancer Paternal Uncle     EXAM: BP 136/84 (BP Location: Right Arm)   Pulse (!) 105   Temp 98.4 F (36.9 C) (Oral)   Ht 5\' 2"  (1.575 m)   Wt 70.8 kg   SpO2 100%   BMI 28.53 kg/m  CONSTITUTIONAL: Alert and oriented and responds appropriately to questions. Well-appearing; well-nourished HEAD: Normocephalic EYES: Conjunctivae clear, pupils appear equal, EOMI ENT: normal nose; moist mucous membranes NECK: Supple, no meningismus, no nuchal rigidity, no LAD  CARD: RRR; S1 and S2 appreciated; no murmurs, no clicks, no rubs, no gallops RESP: Normal chest excursion without splinting or tachypnea; breath sounds clear and equal bilaterally; no wheezes, no rhonchi, no rales, no hypoxia or respiratory distress, speaking full sentences ABD/GI: Normal bowel sounds; non-distended; soft, non-tender, no rebound, no guarding, no peritoneal signs, no hepatosplenomegaly BACK:  The back appears normal and is non-tender to palpation, there is no CVA tenderness RECTAL: No gross blood or melena, no external hemorrhoids on exam, patient does have an area that is draining purulent drainage from  the right inner buttock.  She has fluctuance and induration that extends over to the left buttock and inside of the rectum.  No subcutaneous emphysema noted. EXT: Normal ROM in all joints; non-tender to palpation; no edema; normal capillary refill; no cyanosis, no calf tenderness or swelling    SKIN: Normal color for age and race; warm; no rash NEURO: Moves all extremities equally PSYCH: The patient's mood and manner are appropriate. Grooming and personal hygiene are  appropriate.  MEDICAL DECISION MAKING: Patient here with what appears to be a perianal abscess but given extension into the rectum will obtain CT imaging to see how far this extends and see if there is any perirectal component, fistula.  Abdominal exam is benign.  She had a near syncopal event today which may have been from pain versus infection.  Will obtain EKG.  She denies any chest pain or shortness of breath.  Will obtain labs.  Will give pain and nausea medicine.  ED PROGRESS: CT scan shows bilateral perianal fistulas but no obvious abscess seen.  She does have purulent drainage on examination.  I have incised and drained these areas and sent a wound culture.  Will discharge on clindamycin.  No perirectal abscess noted.  Will give outpatient general surgery follow-up if symptoms not improving or return.  Patient comfortable with this plan.  At this time, I do not feel there is any life-threatening condition present. I have reviewed and discussed all results (EKG, imaging, lab, urine as appropriate) and exam findings with patient/family. I have reviewed nursing notes and appropriate previous records.  I feel the patient is safe to be discharged home without further emergent workup and can continue workup as an outpatient as needed. Discussed usual and customary return precautions. Patient/family verbalize understanding and are comfortable with this plan.  Outpatient follow-up has been provided if needed. All questions have been answered.      EKG Interpretation  Date/Time:  Friday February 15 2018 06:05:16 EDT Ventricular Rate:  94 PR Interval:    QRS Duration: 90 QT Interval:  301 QTC Calculation: 377 R Axis:   76 Text Interpretation:  Sinus rhythm Nonspecific T abnormalities, diffuse leads No old tracing to compare Confirmed by Shahzain Kiester, Baxter Hire [phone]) on 02/15/2018 6:24:02 AM       INCISION AND DRAINAGE Performed by: Baxter Hire Alliana Mcauliff Consent: Verbal consent obtained. Risks and benefits:  risks, benefits and alternatives were discussed Type: abscess  Body area: Perianal region  Anesthesia: local infiltration  Incision was made with a scalpel.  Local anesthetic: lidocaine 2 % with epinephrine  Anesthetic total: 3 ml  Complexity: complex Blunt dissection to break up loculations  Drainage: purulent  Drainage amount: Small  Packing material: none  Patient tolerance: Patient tolerated the procedure well with no immediate complications.      Jguadalupe Opiela, Layla Maw, DO 02/15/18 [id number]    Apoorva Bugay, Layla Maw, DO 02/15/18 [phone]

## 2018-02-15 NOTE — ED Notes (Addendum)
Disregard this ED NOTE FILED.

## 2018-02-15 NOTE — Discharge Instructions (Addendum)
To find a primary care or specialty doctor please call [phone] or [phone] to access "[facility] Find a Doctor Service." ° °You may also go on the [facility] website at [email or web address] ° °There are also multiple [facility], [facility], [facility] and [facility] practices throughout the [place] that are frequently accepting new patients. You may find a clinic that is close to your home and contact them. ° °[facility] -  °[address] °[place] [address] °[phone] ° ° °[facility] -  °[address] °[place] [address] [phone] ° ° °[facility] - °[address]  °[place] [address] °[phone] ° ° °

## 2018-02-15 NOTE — ED Triage Notes (Signed)
Pt from home with c/o recurrent hemorrhoids. Pt states she noticed bleeding last night. Pt states due to bleeding and pain she took a shower. Pt states this made her feel lightheaded. Pt denies lightheadedness at present. Pt states earlier today she had heavy bleeding from hemorrhoid, but states bleeding is much lighter now. Pt has hx of anemia.

## 2018-02-18 LAB — AEROBIC CULTURE W GRAM STAIN (SUPERFICIAL SPECIMEN)

## 2018-02-18 LAB — AEROBIC CULTURE  (SUPERFICIAL SPECIMEN)

## 2018-02-19 ENCOUNTER — Telehealth: Payer: Self-pay | Admitting: Emergency Medicine

## 2018-02-19 NOTE — Telephone Encounter (Signed)
Post ED Visit - Positive Culture Follow-up  Culture report reviewed by antimicrobial stewardship pharmacist:  []  Enzo Bi, Pharm.D. []  Celedonio Miyamoto, Pharm.D., BCPS AQ-ID []  Garvin Fila, Pharm.D., BCPS []  Georgina Pillion, Pharm.D., BCPS []  [place], [place].D., BCPS, AAHIVP []  Estella Husk, Pharm.D., BCPS, AAHIVP []  Lysle Pearl, PharmD, BCPS []  Phillips Climes, PharmD, BCPS []  Agapito Games, PharmD, BCPS []  Verlan Friends, PharmD Michel Harrow PharmD  Positive wound culture Treated with clindamycin, organism sensitive to the same and no further patient follow-up is required at this time.  Berle Mull 02/19/2018, 3:42 PM

## 2018-02-28 ENCOUNTER — Ambulatory Visit (INDEPENDENT_AMBULATORY_CARE_PROVIDER_SITE_OTHER): Payer: Medicaid Other | Admitting: *Deleted

## 2018-02-28 VITALS — BP 106/72 | HR 69

## 2018-02-28 DIAGNOSIS — Z23 Encounter for immunization: Secondary | ICD-10-CM | POA: Diagnosis not present

## 2018-02-28 NOTE — Progress Notes (Signed)
Pt here for Gardasil #3 , pt denies any problems with previous injections.Pt tolerated injection well today.  Scheryl Martenhristine Alvaretta Eisenberger, RN

## 2018-02-28 NOTE — Progress Notes (Signed)
I have reviewed the chart and agree with nursing staff's documentation of this patient's encounter.  Jaynie CollinsUgonna Brittainy Bucker, MD 02/28/2018 12:21 PM

## 2018-03-07 DIAGNOSIS — H5213 Myopia, bilateral: Secondary | ICD-10-CM | POA: Diagnosis not present

## 2018-03-07 DIAGNOSIS — H16223 Keratoconjunctivitis sicca, not specified as Sjogren's, bilateral: Secondary | ICD-10-CM | POA: Diagnosis not present

## 2018-03-07 DIAGNOSIS — H40033 Anatomical narrow angle, bilateral: Secondary | ICD-10-CM | POA: Diagnosis not present

## 2018-03-18 DIAGNOSIS — H1013 Acute atopic conjunctivitis, bilateral: Secondary | ICD-10-CM | POA: Diagnosis not present

## 2018-03-18 DIAGNOSIS — H5203 Hypermetropia, bilateral: Secondary | ICD-10-CM | POA: Diagnosis not present

## 2018-03-22 DIAGNOSIS — H10013 Acute follicular conjunctivitis, bilateral: Secondary | ICD-10-CM | POA: Diagnosis not present

## 2018-05-27 DIAGNOSIS — N760 Acute vaginitis: Secondary | ICD-10-CM | POA: Diagnosis not present

## 2018-05-27 DIAGNOSIS — Z113 Encounter for screening for infections with a predominantly sexual mode of transmission: Secondary | ICD-10-CM | POA: Diagnosis not present

## 2018-08-08 ENCOUNTER — Other Ambulatory Visit: Payer: Self-pay | Admitting: *Deleted

## 2018-08-08 MED ORDER — FLUCONAZOLE 150 MG PO TABS: 150.0000 mg | ORAL_TABLET | Freq: Once | ORAL | 1 refills | Status: AC

## 2018-08-29 DIAGNOSIS — R21 Rash and other nonspecific skin eruption: Secondary | ICD-10-CM | POA: Diagnosis not present

## 2018-09-07 DIAGNOSIS — R51 Headache: Secondary | ICD-10-CM | POA: Diagnosis not present

## 2018-12-12 ENCOUNTER — Ambulatory Visit: Payer: Medicaid Other | Admitting: Family Medicine

## 2018-12-25 ENCOUNTER — Other Ambulatory Visit: Payer: Self-pay

## 2018-12-25 ENCOUNTER — Ambulatory Visit (INDEPENDENT_AMBULATORY_CARE_PROVIDER_SITE_OTHER): Payer: Medicaid Other | Admitting: Family Medicine

## 2018-12-25 ENCOUNTER — Encounter: Payer: Self-pay | Admitting: Family Medicine

## 2018-12-25 VITALS — BP 115/76 | HR 74 | Ht 62.0 in | Wt 162.0 lb

## 2018-12-25 DIAGNOSIS — Z30432 Encounter for removal of intrauterine contraceptive device: Secondary | ICD-10-CM

## 2018-12-25 DIAGNOSIS — Z30011 Encounter for initial prescription of contraceptive pills: Secondary | ICD-10-CM

## 2018-12-25 MED ORDER — NORGESTIMATE-ETH ESTRADIOL 0.25-35 MG-MCG PO TABS: 1.0000 | ORAL_TABLET | Freq: Every day | ORAL | 11 refills | Status: DC

## 2018-12-25 NOTE — Progress Notes (Signed)
   GYNECOLOGY PROBLEM  VISIT ENCOUNTER NOTE  Subjective:   Veronica [name] is a 23 y.o. G56P1001 female here for a a problem GYN visit.  Current complaints: Desires IUD removal, start OCP. IUD placed in 2015.   Denies abnormal vaginal bleeding, discharge, pelvic pain, problems with intercourse or other gynecologic concerns.    Gynecologic History No LMP recorded. (Menstrual status: IUD). Contraception: IUD  Pap in 2019- up to date  Health Maintenance Due  Topic Date Due  . INFLUENZA VACCINE  11/16/2018     The following portions of the patient's history were reviewed and updated as appropriate: allergies, current medications, past family history, past medical history, past social history, past surgical history and problem list.  Review of Systems Pertinent items are noted in HPI.   Objective:  BP 115/76   Pulse 74   Ht 5\' 2"  (1.575 m)   Wt 162 lb (73.5 kg)   BMI 29.63 kg/m  Gen: well appearing, NAD HEENT: no scleral icterus CV: RR Lung: Normal WOB Ext: warm well perfused  PELVIC: Normal appearing external genitalia; normal appearing vaginal mucosa and cervix.  No abnormal discharge noted. No uterine tenderness.   IUD Removal  Patient identified, informed consent performed, consent signed.  Patient was in the dorsal lithotomy position, normal external genitalia was noted.  A speculum was placed in the patient's vagina, normal discharge was noted, no lesions. The cervix was visualized, no lesions, no abnormal discharge.  The strings of the IUD were grasped and pulled using kelly forceps due to short/poorly visualized strings that were flush with external os. The IUD was removed in its entirety. Patient tolerated the procedure well.    Patient will use OCP for contraception.  Routine preventative health maintenance measures emphasized.    Assessment and Plan:   1. Encounter for IUD removal Performed successfully  2. Encounter for initial prescription of contraceptive  pills -Reviewed SE profile - norgestimate-ethinyl estradiol (ORTHO-CYCLEN) 0.25-35 MG-MCG tablet; Take 1 tablet by mouth daily.  Dispense: 1 Package; Refill: 11   Please refer to After Visit Summary for other counseling recommendations.   Return in about 1 year (around 12/25/2019) for Yearly wellness exam.  Caren Macadam, MD, MPH, ABFM Attending [place] for [facility]

## 2018-12-25 NOTE — Progress Notes (Signed)
Pt would like OCP 

## 2019-01-15 DIAGNOSIS — L0390 Cellulitis, unspecified: Secondary | ICD-10-CM | POA: Diagnosis not present

## 2019-01-15 DIAGNOSIS — L738 Other specified follicular disorders: Secondary | ICD-10-CM | POA: Diagnosis not present

## 2019-03-20 DIAGNOSIS — L02419 Cutaneous abscess of limb, unspecified: Secondary | ICD-10-CM | POA: Diagnosis not present

## 2019-03-25 ENCOUNTER — Other Ambulatory Visit: Payer: Self-pay | Admitting: *Deleted

## 2019-03-25 MED ORDER — FLUCONAZOLE 150 MG PO TABS: 150.0000 mg | ORAL_TABLET | Freq: Once | ORAL | 1 refills | Status: AC

## 2019-03-28 DIAGNOSIS — Z20828 Contact with and (suspected) exposure to other viral communicable diseases: Secondary | ICD-10-CM | POA: Diagnosis not present

## 2019-05-18 DIAGNOSIS — J4520 Mild intermittent asthma, uncomplicated: Secondary | ICD-10-CM | POA: Diagnosis not present

## 2019-10-21 ENCOUNTER — Other Ambulatory Visit: Payer: Self-pay | Admitting: *Deleted

## 2019-10-21 MED ORDER — FLUCONAZOLE 150 MG PO TABS: 150.0000 mg | ORAL_TABLET | Freq: Once | ORAL | 1 refills | Status: AC

## 2019-11-04 ENCOUNTER — Encounter: Payer: Self-pay | Admitting: Radiology

## 2019-11-23 IMAGING — CT CT PELVIS W/ CM
2 of 3 series · 16 of 46 positions shown, 18 images · IV contrast (ISOVUE)
Comparison: None.

CLINICAL DATA: Perianal abscess.

EXAM:
CT PELVIS WITH CONTRAST
TECHNIQUE: Multidetector CT imaging of the pelvis was performed using the
standard protocol following the bolus administration of intravenous
contrast.
CONTRAST:  100mL QFBICU-MLL IOPAMIDOL (QFBICU-MLL) INJECTION 61%

[Series 4: axial st · axial · 0.92mm/px · z∈[+1179,+1395]mm · 13 of 125 slices shown, 15 images]
[im 9/125  soft-tissue]
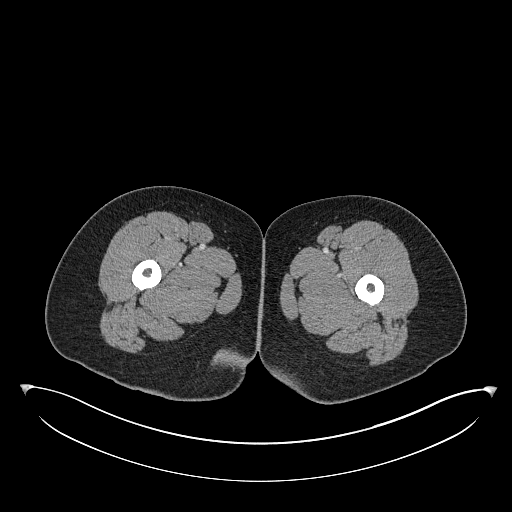
[im 9/125  bone]
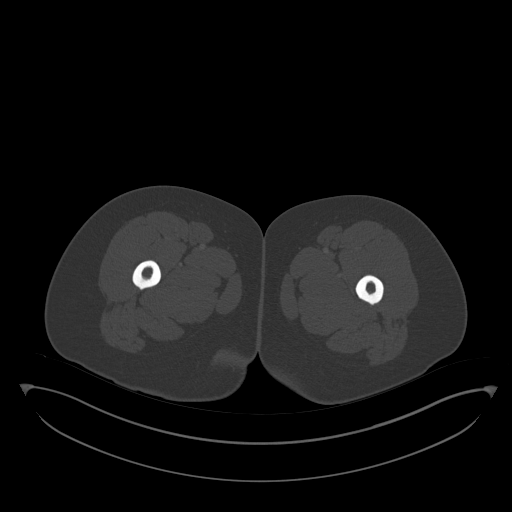
[im 17/125  soft-tissue]
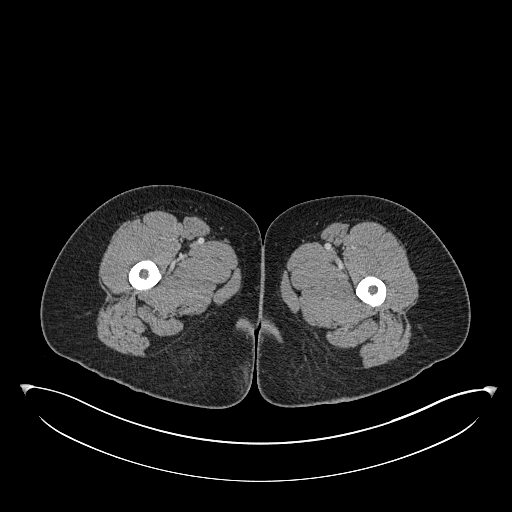
[im 25/125  soft-tissue]
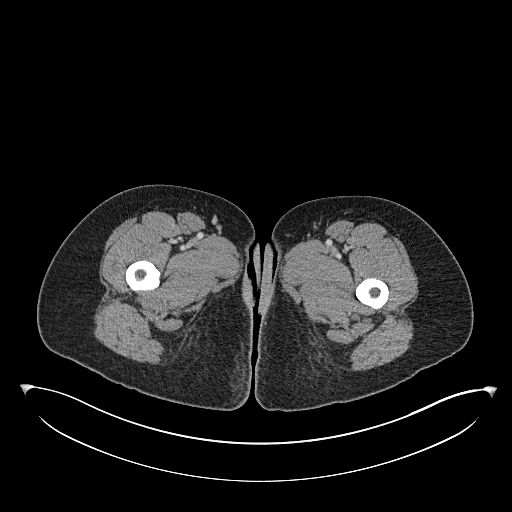
[im 37/125  soft-tissue]
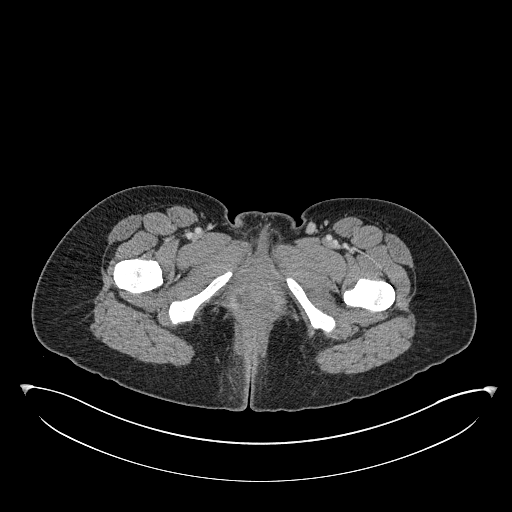
[im 45/125  soft-tissue]
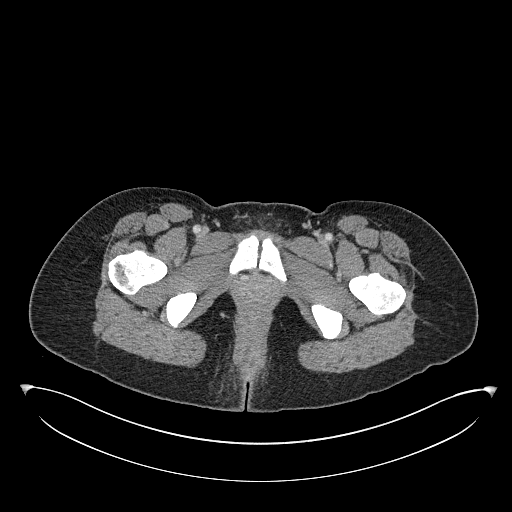
[im 53/125  soft-tissue]
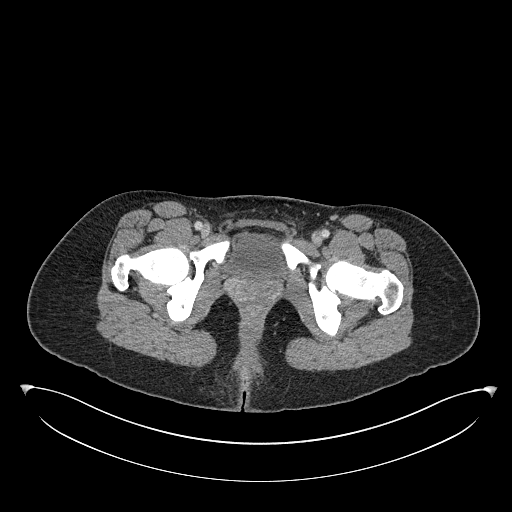
[im 65/125  soft-tissue]
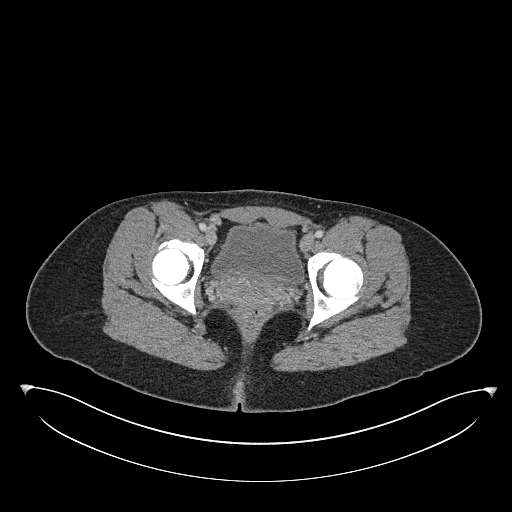
[im 73/125  soft-tissue]
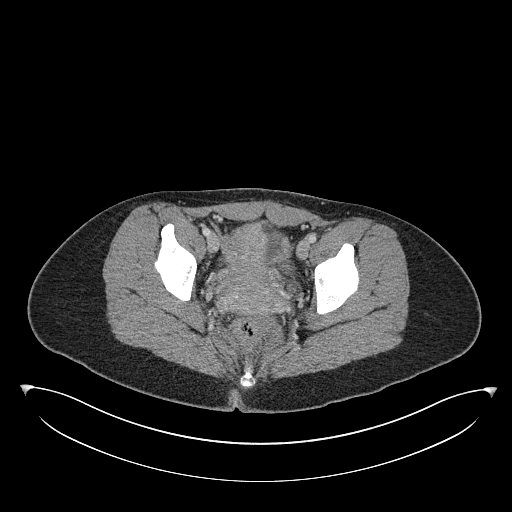
[im 81/125  soft-tissue]
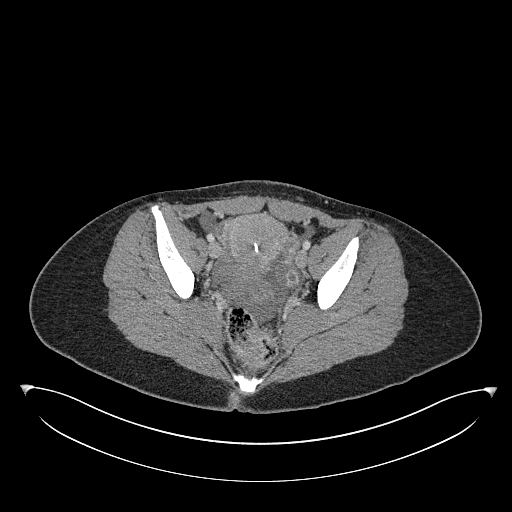
[im 81/125  bone]
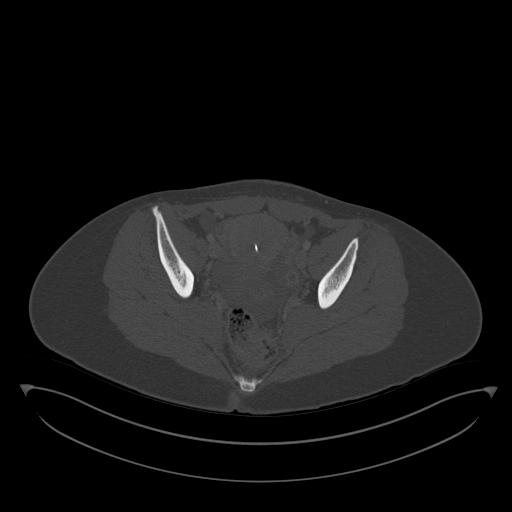
[im 89/125  soft-tissue]
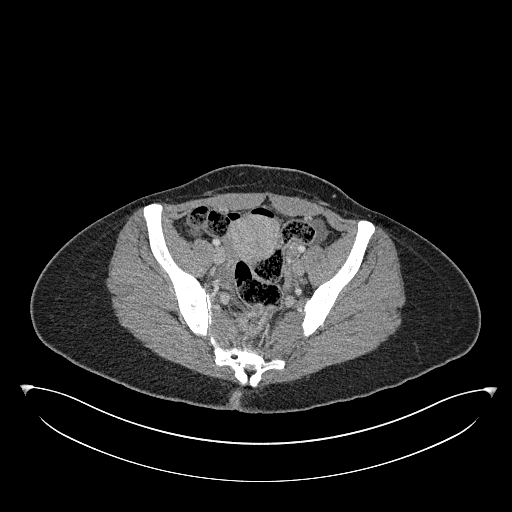
[im 101/125  soft-tissue]
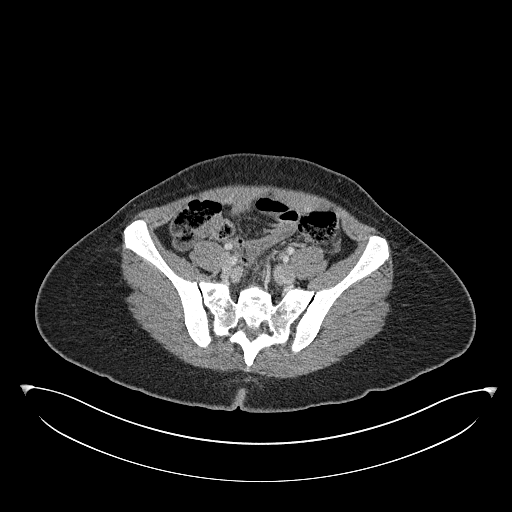
[im 109/125  soft-tissue]
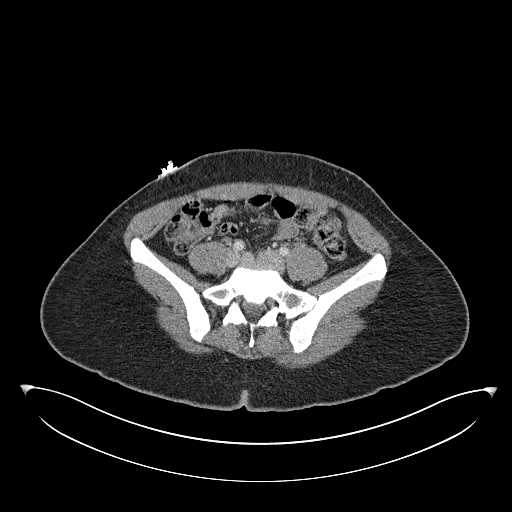
[im 117/125  soft-tissue]
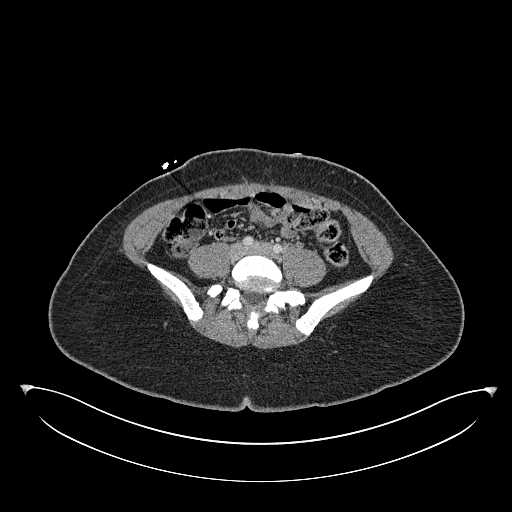

[Series 9: coronal st · coronal · 0.49mm/px · 3 of 118 slices shown]
[im 40/118  soft-tissue]
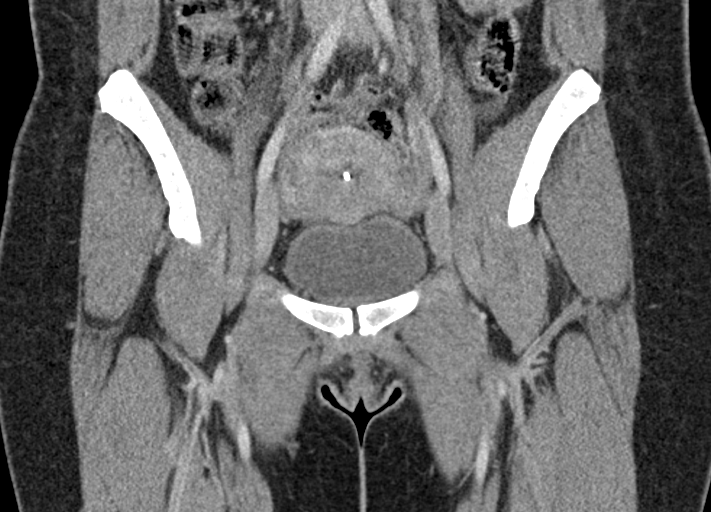
[im 53/118  soft-tissue]
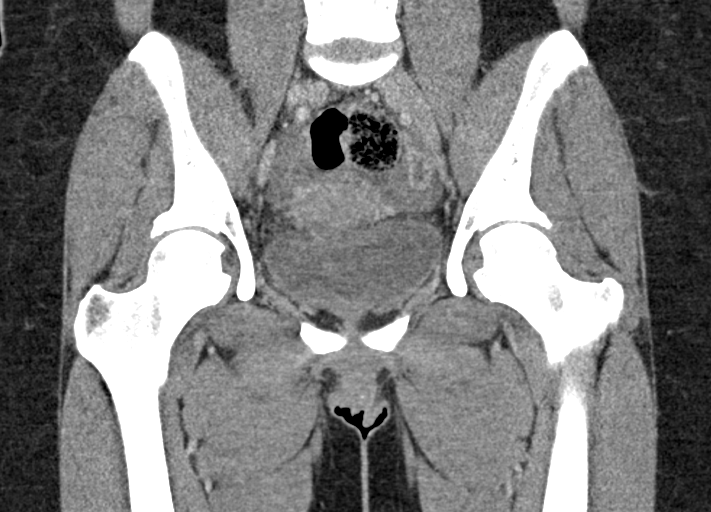
[im 66/118  soft-tissue]
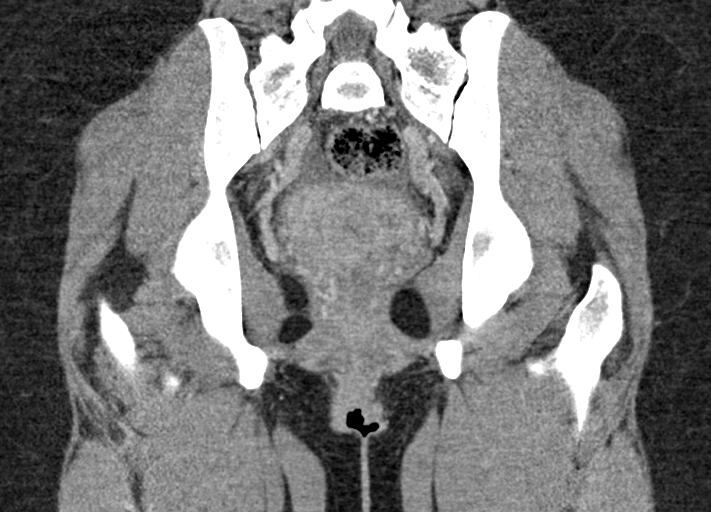

[16 of 46 positions shown; findings below may reference images not displayed]

FINDINGS: Urinary Tract: Unremarkable urinary bladder.

Bowel: A small right-sided perianal fistula is seen within the
intersphincteric plane, which measures approximately 1.8 x 0.9 cm. A
left-sided perianal fistula is also seen in the intersphincteric
plane measuring approximately 1.4 x 0.7 cm. Adjacent inflammatory
changes are seen in the extra-sphincteric perianal fat, but no
abscess identified..

Vascular/Lymphatic: No pathologically enlarged lymph nodes or other
significant abnormality.

Reproductive:  IUD in appropriate position within the uterus

Other: Tiny amount of free pelvic fluid, within a physiologic range.

Musculoskeletal: No significant abnormality identified.
IMPRESSION: Bilateral perianal fistulae within the intersphincteric planes. No
extra-sphincteric extension or abscess identified.

## 2019-11-26 ENCOUNTER — Ambulatory Visit: Payer: Medicaid Other | Admitting: Family Medicine

## 2019-12-10 ENCOUNTER — Ambulatory Visit: Payer: Medicaid Other | Admitting: Obstetrics and Gynecology

## 2019-12-18 DIAGNOSIS — J029 Acute pharyngitis, unspecified: Secondary | ICD-10-CM | POA: Diagnosis not present

## 2019-12-18 DIAGNOSIS — Z20822 Contact with and (suspected) exposure to covid-19: Secondary | ICD-10-CM | POA: Diagnosis not present

## 2020-06-23 ENCOUNTER — Other Ambulatory Visit (HOSPITAL_COMMUNITY)
Admission: RE | Admit: 2020-06-23 | Discharge: 2020-06-23 | Disposition: A | Discharge status: Home / Self Care | Payer: 59 | Source: Ambulatory Visit | Attending: Family Medicine | Admitting: Family Medicine

## 2020-06-23 ENCOUNTER — Other Ambulatory Visit: Payer: Self-pay

## 2020-06-23 ENCOUNTER — Encounter: Payer: Self-pay | Admitting: Family Medicine

## 2020-06-23 ENCOUNTER — Ambulatory Visit (INDEPENDENT_AMBULATORY_CARE_PROVIDER_SITE_OTHER): Payer: Medicaid Other | Admitting: Family Medicine

## 2020-06-23 VITALS — BP 127/81 | HR 84 | Ht 62.0 in | Wt 171.0 lb

## 2020-06-23 DIAGNOSIS — E669 Obesity, unspecified: Secondary | ICD-10-CM

## 2020-06-23 DIAGNOSIS — Z01419 Encounter for gynecological examination (general) (routine) without abnormal findings: Secondary | ICD-10-CM | POA: Insufficient documentation

## 2020-06-23 NOTE — Progress Notes (Signed)
Discuss vaginal dryness

## 2020-06-23 NOTE — Progress Notes (Signed)
   GYNECOLOGY ANNUAL PREVENTATIVE CARE ENCOUNTER NOTE  Subjective:   Veronica [name] is a 25 y.o. G24P1001 female here for a routine annual gynecologic exam.  Current complaints: vaginal dyrness.   Denies abnormal vaginal bleeding, discharge, pelvic pain, problems with intercourse or other gynecologic concerns.    Stopped OCP  Vaginal dryness: after period  Gynecologic History No LMP recorded. Contraception: none Last Pap: 2019. Results were: normal Last mammogram: NA  The following portions of the patient's history were reviewed and updated as appropriate: allergies, current medications, past family history, past medical history, past social history, past surgical history and problem list.  Review of Systems Pertinent items are noted in HPI.   Objective:  BP 127/81   Pulse 84   Ht 5\' 2"  (1.575 m)   Wt 171 lb (77.6 kg)   BMI 31.28 kg/m  CONSTITUTIONAL: Well-developed, well-nourished female in no acute distress.  HENT:  Normocephalic, atraumatic, External right and left ear normal. Oropharynx is clear and moist EYES:  No scleral icterus.  NECK: Normal range of motion, supple, no masses.  Normal thyroid.  SKIN: Skin is warm and dry. No rash noted. Not diaphoretic. No erythema. No pallor. NEUROLOGIC: Alert and oriented to person, place, and time. Normal reflexes, muscle tone coordination. No cranial nerve deficit noted. PSYCHIATRIC: Normal mood and affect. Normal behavior. Normal judgment and thought content. CARDIOVASCULAR: Normal heart rate noted, regular rhythm. 2+ distal pulses. RESPIRATORY: Effort and breath sounds normal, no problems with respiration noted. BREASTS: Symmetric in size. No masses, skin changes, nipple drainage, or lymphadenopathy. ABDOMEN: Soft,  no distention noted.  No tenderness, rebound or guarding.  PELVIC: Normal appearing external genitalia; normal appearing vaginal mucosa and cervix.  No abnormal discharge noted.  Pap smear obtained.  Normal uterine  size, no other palpable masses, no uterine or adnexal tenderness. MUSCULOSKELETAL: Normal range of motion.    Assessment and Plan:  1) Annual gynecologic examination with pap smear:  Will follow up results of pap smear and manage accordingly. STI screen-vaginal only also ordered today.  Routine preventative health maintenance measures emphasized.  2) Contraception counseling: Reviewed all forms of birth control options available including abstinence; over the counter/barrier methods; hormonal contraceptive medication including pill, patch, ring, injection,contraceptive implant; hormonal and nonhormonal IUDs; permanent sterilization options including vasectomy and the various tubal sterilization modalities. Risks and benefits reviewed.  Questions were answered.  Written information was also given to the patient to review.  Patient desires nothing, uses condoms. She will follow up in 1 yr  for surveillance.  She was told to call with any further questions, or with any concerns about this method of contraception.  Emphasized use of condoms 100% of the time for STI prevention.  Constipation: Has chronic constipation. Recommended clean out and starting daily fiber   Please refer to After Visit Summary for other counseling recommendations.   Return in about 1 year (around 06/23/2021) for Yearly wellness exam.  08/23/2021, MD, MPH, ABFM Attending [facility] for [facility]

## 2020-06-23 NOTE — Patient Instructions (Signed)
You have constipation which is hard stools that are difficult to pass. It is important to have regular bowel movements every 1-3 days that are soft and easy to pass. Hard stools increase your risk of hemorrhoids and are very uncomfortable.   To prevent constipation you can increase the amount of fiber in your diet. Examples of foods with fiber are leafy greens, whole grain breads, oatmeal and other grains.  It is also important to drink at least eight 8oz glass of water everyday.   If you have not has a bowel movement in 4-5 days you made need to clean out your bowel.  This will have establish normal movement through your bowel.    Miralax Clean out  Take 8 capfuls of miralax in 64 oz of gatorade. You can use any fluid that appeals to you (gatorade, water, juice)  Continue to drink at least eight 8 oz glasses of water throughout the day  You can repeat with another 8 capfuls of miralax in 64 oz of gatorade if you are not having a large amount of stools  You will need to be at home and close to a bathroom for about 8 hours when you do the above as you may need to go to the bathroom frequently.   After you are cleaned out: - Start Colace100mg  twice daily - Start Miralax once daily - Start a daily fiber supplement like metamucil or citrucel - if you are having diarrhea you can reduce to Colace once a day or miralax every other day or a 1/2 capful daily.

## 2020-06-25 LAB — CYTOLOGY - PAP
Chlamydia: NEGATIVE
Comment: NEGATIVE
Comment: NEGATIVE
Comment: NORMAL
Diagnosis: NEGATIVE
Neisseria Gonorrhea: NEGATIVE
Trichomonas: NEGATIVE

## 2020-07-05 ENCOUNTER — Other Ambulatory Visit: Payer: Self-pay | Admitting: *Deleted

## 2020-07-05 MED ORDER — FLUCONAZOLE 150 MG PO TABS: 150.0000 mg | ORAL_TABLET | Freq: Once | ORAL | 1 refills | Status: AC

## 2020-07-07 ENCOUNTER — Other Ambulatory Visit: Payer: Self-pay | Admitting: *Deleted

## 2020-07-07 MED ORDER — METRONIDAZOLE 500 MG PO TABS: 500.0000 mg | ORAL_TABLET | Freq: Two times a day (BID) | ORAL | 0 refills | Status: DC

## 2021-01-10 ENCOUNTER — Other Ambulatory Visit: Payer: Self-pay | Admitting: *Deleted

## 2021-01-10 MED ORDER — METRONIDAZOLE 500 MG PO TABS: 500.0000 mg | ORAL_TABLET | Freq: Two times a day (BID) | ORAL | 0 refills | Status: DC

## 2021-02-03 ENCOUNTER — Other Ambulatory Visit: Payer: Self-pay

## 2021-02-03 ENCOUNTER — Telehealth: Payer: Self-pay | Admitting: Family Medicine

## 2021-02-03 ENCOUNTER — Other Ambulatory Visit: Payer: Medicaid Other

## 2021-02-03 ENCOUNTER — Ambulatory Visit (INDEPENDENT_AMBULATORY_CARE_PROVIDER_SITE_OTHER): Payer: 59

## 2021-02-03 VITALS — BP 111/75 | HR 73

## 2021-02-03 DIAGNOSIS — Z30011 Encounter for initial prescription of contraceptive pills: Secondary | ICD-10-CM

## 2021-02-03 DIAGNOSIS — Z3009 Encounter for other general counseling and advice on contraception: Secondary | ICD-10-CM

## 2021-02-03 LAB — POCT URINE PREGNANCY: Preg Test, Ur: NEGATIVE

## 2021-02-03 MED ORDER — SLYND 4 MG PO TABS: 1.0000 | ORAL_TABLET | Freq: Every day | ORAL | 12 refills | Status: DC

## 2021-02-03 NOTE — Progress Notes (Signed)
Ms. Gherardi presents today for UPT. She has no unusual complaints.     OBJECTIVE: Appears well, in no apparent distress.  OB History     Gravida  1   Para  1   Term  1   Preterm      AB      Living  1      SAB      IAB      Ectopic      Multiple      Live Births  1          Home UPT Result: N/A In-Office UPT result: Negative  I have reviewed the patient's medical, obstetrical, social, and family histories, and medications.   ASSESSMENT: Negative pregnancy test  PLAN Birth Control Samples of Slynd given to patient.

## 2021-02-08 NOTE — Progress Notes (Signed)
Attestation of Attending Supervision of clinical support staff: I agree with the care provided to this patient and was available for any consultation.  I have reviewed the CMA's note and chart, and I agree with the management and plan.  Debie Ashline MD MPH, ABFM Attending [facility]ician [facility]- [facility]  

## 2021-02-09 NOTE — Telephone Encounter (Signed)
Patient came to office for UPT and to get samples. Additional packs sent to pharmacy

## 2021-02-22 ENCOUNTER — Emergency Department (INDEPENDENT_AMBULATORY_CARE_PROVIDER_SITE_OTHER)
Admission: RE | Admit: 2021-02-22 | Discharge: 2021-02-22 | Disposition: A | Discharge status: Home / Self Care | Payer: 59 | Source: Ambulatory Visit | Attending: Family Medicine | Admitting: Family Medicine

## 2021-02-22 ENCOUNTER — Other Ambulatory Visit: Payer: Self-pay

## 2021-02-22 VITALS — BP 127/85 | HR 66 | Temp 99.1°F | Resp 20 | Ht 62.0 in | Wt 169.0 lb

## 2021-02-22 DIAGNOSIS — B349 Viral infection, unspecified: Secondary | ICD-10-CM

## 2021-02-22 LAB — POCT RAPID STREP A (OFFICE): Rapid Strep A Screen: NEGATIVE

## 2021-02-22 NOTE — ED Triage Notes (Signed)
Pt presents to Urgent Care with c/o headache, sore throat, and N/V since this AM. Has not done COVID test; vaccinated.

## 2021-02-22 NOTE — Discharge Instructions (Signed)
Rest, push fluids Take OTC pain medicine if needed Call for problems

## 2021-02-22 NOTE — ED Provider Notes (Signed)
[facility]    CSN: [id number] Arrival date & time: 02/22/21  1721      History   Chief Complaint Chief Complaint  Patient presents with   Headache   Sore Throat   Emesis    HPI Veronica [name] is a 25 y.o. female.   HPI  Sore throat for 1 day.  Some headache and vomiting.  No fever or chills.  No body aches.  Mild fatigue.  No known exposure to influenza.  Had strep a lot as a child.  Needs a note for work.  No vomiting  Past Medical History:  Diagnosis Date   Acne    Anemia    Asthma    Childhood    Patient Active Problem List   Diagnosis Date Noted   Obesity (BMI 30-39.9) 06/23/2020   S/P cesarean section 11/10/2012   BV (bacterial vaginosis) 07/12/2012    Past Surgical History:  Procedure Laterality Date   CESAREAN SECTION N/A 11/10/2012   Procedure: Primary cesarean section with delivery of baby girl at 0731. Apgars 8/9.;  Surgeon: Tereso Newcomer, MD;  Location: [facility];  Service: Obstetrics;  Laterality: N/A;   CESAREAN SECTION     head surgery     Top of head epidermal but put to sleep    OB History     Gravida  1   Para  1   Term  1   Preterm      AB      Living  1      SAB      IAB      Ectopic      Multiple      Live Births  1            Home Medications    Prior to Admission medications   Medication Sig Start Date End Date Taking? Authorizing Provider  naproxen sodium (ALEVE) 220 MG tablet Take 220 mg by mouth.   Yes [provider]  Drospirenone (SLYND) 4 MG TABS Take 1 tablet by mouth daily. 02/03/21   Federico Flake, MD  metroNIDAZOLE (FLAGYL) 500 MG tablet Take 1 tablet (500 mg total) by mouth 2 (two) times daily. 01/10/21   Federico Flake, MD  phentermine 37.5 MG capsule Take 37.5 mg by mouth every morning.    [provider]  Probiotic CAPS Take 1 capsule by mouth daily. 02/15/18   Ward, Layla Maw, DO    Family History Family History  Problem Relation Age of  Onset   Hypertension Mother    Healthy Father    Hyperlipidemia Maternal Grandmother    Hypertension Maternal Grandmother    Diabetes Paternal Grandmother    Lung cancer Paternal Grandfather    Cancer Paternal Uncle     Social History Social History   Tobacco Use   Smoking status: Never   Smokeless tobacco: Never  Vaping Use   Vaping Use: Never used  Substance Use Topics   Alcohol use: Yes    Comment: Socially   Drug use: No     Allergies   Sulfa antibiotics   Review of Systems Review of Systems See HPI  Physical Exam Triage Vital Signs ED Triage Vitals  Enc Vitals Group     BP 02/22/21 1829 127/85     Pulse Rate 02/22/21 1829 66     Resp 02/22/21 1829 20     Temp 02/22/21 1829 99.1 F (37.3 C)     Temp Source  02/22/21 1829 Oral     SpO2 02/22/21 1829 100 %     Weight 02/22/21 1823 169 lb (76.7 kg)     Height 02/22/21 1823 5\' 2"  (1.575 m)     Head Circumference --      Peak Flow --      Pain Score 02/22/21 1823 7     Pain Loc --      Pain Edu? --      Excl. in GC? --    No data found.  Updated Vital Signs BP 127/85 (BP Location: Right Arm)   Pulse 66   Temp 99.1 F (37.3 C) (Oral)   Resp 20   Ht 5\' 2"  (1.575 m)   Wt 76.7 kg   LMP 02/19/2021   SpO2 100%   BMI 30.91 kg/m     Physical Exam Constitutional:      General: She is not in acute distress.    Appearance: She is well-developed. She is ill-appearing.  HENT:     Head: Normocephalic and atraumatic.     Right Ear: Tympanic membrane and ear canal normal.     Left Ear: Tympanic membrane and ear canal normal.     Nose: No congestion or rhinorrhea.     Mouth/Throat:     Pharynx: Posterior oropharyngeal erythema present.     Comments: Erythema, no exudate Eyes:     Conjunctiva/sclera: Conjunctivae normal.     Pupils: Pupils are equal, round, and reactive to light.  Cardiovascular:     Rate and Rhythm: Normal rate.     Heart sounds: Normal heart sounds.  Pulmonary:     Effort:  Pulmonary effort is normal. No respiratory distress.     Breath sounds: Normal breath sounds.  Abdominal:     General: There is no distension.     Palpations: Abdomen is soft.  Musculoskeletal:        General: Normal range of motion.     Cervical back: Normal range of motion.  Lymphadenopathy:     Cervical: No cervical adenopathy.  Skin:    General: Skin is warm and dry.  Neurological:     Mental Status: She is alert.     UC Treatments / Results  Labs (all labs ordered are listed, but only abnormal results are displayed) Labs Reviewed  POCT RAPID STREP A (OFFICE) - Normal    EKG   Radiology No results found.  Procedures Procedures (including critical care time)  Medications Ordered in UC Medications - No data to display  Initial Impression / Assessment and Plan / UC Course  I have reviewed the triage vital signs and the nursing notes.  Pertinent labs & imaging results that were available during my care of the patient were reviewed by me and considered in my medical decision making (see chart for details).     Rapid strep is negative.  We will treat as a viral pharyngitis.Check MyChart for test results Final Clinical Impressions(s) / UC Diagnoses   Final diagnoses:  Viral illness     Discharge Instructions      Rest, push fluids Take OTC pain medicine if needed Call for problems   ED Prescriptions   None    PDMP not reviewed this encounter.   , MD 02/27/21 [phone]

## 2021-02-23 ENCOUNTER — Ambulatory Visit: Payer: Medicaid Other

## 2021-05-21 ENCOUNTER — Encounter: Payer: Self-pay | Admitting: Radiology

## 2021-08-08 ENCOUNTER — Ambulatory Visit: Payer: Medicaid Other

## 2021-08-08 ENCOUNTER — Emergency Department (INDEPENDENT_AMBULATORY_CARE_PROVIDER_SITE_OTHER)
Admission: RE | Admit: 2021-08-08 | Discharge: 2021-08-08 | Disposition: A | Discharge status: Home / Self Care | Payer: Medicaid Other | Source: Ambulatory Visit | Attending: Family Medicine | Admitting: Family Medicine

## 2021-08-08 ENCOUNTER — Emergency Department (INDEPENDENT_AMBULATORY_CARE_PROVIDER_SITE_OTHER): Payer: Medicaid Other

## 2021-08-08 DIAGNOSIS — R0781 Pleurodynia: Secondary | ICD-10-CM

## 2021-08-08 DIAGNOSIS — S9002XA Contusion of left ankle, initial encounter: Secondary | ICD-10-CM | POA: Diagnosis not present

## 2021-08-08 DIAGNOSIS — S8002XA Contusion of left knee, initial encounter: Secondary | ICD-10-CM

## 2021-08-08 DIAGNOSIS — S40012A Contusion of left shoulder, initial encounter: Secondary | ICD-10-CM | POA: Diagnosis not present

## 2021-08-08 DIAGNOSIS — S20212A Contusion of left front wall of thorax, initial encounter: Secondary | ICD-10-CM | POA: Diagnosis not present

## 2021-08-08 LAB — POCT URINE PREGNANCY: Preg Test, Ur: NEGATIVE

## 2021-08-08 MED ORDER — CYCLOBENZAPRINE HCL 10 MG PO TABS: 10.0000 mg | ORAL_TABLET | Freq: Every day | ORAL | 0 refills | Status: DC
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
## 2021-08-08 NOTE — Discharge Instructions (Signed)
May take Aleve, two tabs every 12 hours for 5 to 7 days until improved. ?Apply ice pack for 20 to 30 minutes, 2 to 3 times daily to painful areas.  Continue until pain and swelling decrease.  ?

## 2021-09-19 ENCOUNTER — Emergency Department (INDEPENDENT_AMBULATORY_CARE_PROVIDER_SITE_OTHER)
Admission: RE | Admit: 2021-09-19 | Discharge: 2021-09-19 | Disposition: A | Discharge status: Home / Self Care | Payer: Medicaid Other | Source: Ambulatory Visit

## 2021-09-19 VITALS — BP 117/82 | HR 70 | Temp 99.5°F | Resp 15 | Ht 62.0 in | Wt 167.5 lb

## 2021-09-19 DIAGNOSIS — J029 Acute pharyngitis, unspecified: Secondary | ICD-10-CM | POA: Diagnosis not present

## 2021-09-19 LAB — POCT RAPID STREP A (OFFICE): Rapid Strep A Screen: NEGATIVE

## 2021-09-19 MED ORDER — AMOXICILLIN 875 MG PO TABS: 875.0000 mg | ORAL_TABLET | Freq: Two times a day (BID) | ORAL | 0 refills | Status: DC

## 2021-09-19 NOTE — ED Triage Notes (Signed)
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

## 2021-09-19 NOTE — ED Provider Notes (Signed)
[name]    CSN: [id number] Arrival date & time: 09/19/21  1203      History   Chief Complaint Chief Complaint  Patient presents with   Sore Throat    Left side     HPI Veronica [name] is a 26 y.o. female.   HPI Patient here with left ear, throat pain, headache, and chills. Exposed to strep x 1 week ago. Taken OTC medication without relief. Past Medical History:  Diagnosis Date   Acne    Anemia    Asthma    Childhood    Patient Active Problem List   Diagnosis Date Noted   Obesity (BMI 30-39.9) 06/23/2020   S/P cesarean section 11/10/2012   BV (bacterial vaginosis) 07/12/2012    Past Surgical History:  Procedure Laterality Date   CESAREAN SECTION N/A 11/10/2012   Procedure: Primary cesarean section with delivery of baby girl at 0731. Apgars 8/9.;  Surgeon: Tereso Newcomer, MD;  Location: [facility];  Service: Obstetrics;  Laterality: N/A;   CESAREAN SECTION     head surgery     Top of head epidermal but put to sleep    OB History     Gravida  1   Para  1   Term  1   Preterm      AB      Living  1      SAB      IAB      Ectopic      Multiple      Live Births  1            Home Medications    Prior to Admission medications   Medication Sig Start Date End Date Taking? Authorizing Provider  amoxicillin (AMOXIL) 875 MG tablet Take 1 tablet (875 mg total) by mouth 2 (two) times daily. 09/19/21  Yes Bing Neighbors, FNP  phentermine (ADIPEX-P) 37.5 MG tablet Take by mouth. 12/04/20  Yes [provider]  cyclobenzaprine (FLEXERIL) 10 MG tablet Take 1 tablet (10 mg total) by mouth at bedtime. Patient not taking: Reported on 09/19/2021 08/08/21   Lattie Haw, MD  Drospirenone (SLYND) 4 MG TABS Take 1 tablet by mouth daily. Patient not taking: Reported on 08/08/2021 02/03/21   Federico Flake, MD  metroNIDAZOLE (FLAGYL) 500 MG tablet Take 1 tablet (500 mg total) by mouth 2 (two) times daily. Patient not  taking: Reported on 08/08/2021 01/10/21   Federico Flake, MD  naproxen sodium (ALEVE) 220 MG tablet Take 220 mg by mouth. Patient not taking: Reported on 08/08/2021    [provider]  phentermine 37.5 MG capsule Take 37.5 mg by mouth every morning. Patient not taking: Reported on 08/08/2021    [provider]  Probiotic CAPS Take 1 capsule by mouth daily. Patient not taking: Reported on 08/08/2021 02/15/18   Ward, Layla Maw, DO    Family History Family History  Problem Relation Age of Onset   Hypertension Mother    Healthy Father    Hyperlipidemia Maternal Grandmother    Hypertension Maternal Grandmother    Diabetes Paternal Grandmother    Lung cancer Paternal Grandfather    Cancer Paternal Uncle     Social History Social History   Tobacco Use   Smoking status: Never   Smokeless tobacco: Never  Vaping Use   Vaping Use: Never used  Substance Use Topics   Alcohol use: Yes    Comment: Socially   Drug use: No  Allergies   Sulfa antibiotics   Review of Systems Review of Systems Pertinent negatives listed in HPI   Physical Exam Triage Vital Signs ED Triage Vitals  Enc Vitals Group     BP 09/19/21 1256 117/82     Pulse Rate 09/19/21 1256 70     Resp 09/19/21 1256 15     Temp 09/19/21 1256 99.5 F (37.5 C)     Temp Source 09/19/21 1256 Oral     SpO2 09/19/21 1256 100 %     Weight 09/19/21 1259 167 lb 8.8 oz (76 kg)     Height 09/19/21 1259 5\' 2"  (1.575 m)     Head Circumference --      Peak Flow --      Pain Score 09/19/21 1258 8     Pain Loc --      Pain Edu? --      Excl. in GC? --    No data found.  Updated Vital Signs BP 117/82 (BP Location: Right Arm)   Pulse 70   Temp 99.5 F (37.5 C) (Oral)   Resp 15   Ht 5\' 2"  (1.575 m)   Wt 167 lb 8.8 oz (76 kg)   LMP 09/05/2021 (Approximate)   SpO2 100%   BMI 30.65 kg/m   Visual Acuity Right Eye Distance:   Left Eye Distance:   Bilateral Distance:    Right Eye Near:   Left  Eye Near:    Bilateral Near:     Physical Exam General Appearance:    Alert, cooperative, no distress  HENT:  Normocephalic, both sides TM normal without fluid or infection, postauricular lymphadenopathy (left) , neck has both sides anterior cervical nodes enlarged, pharynx erythematous without exudate, tonsils red, enlarged, with exudate present, and nares patent without mucosal edema    Eyes:    PERRL, conjunctiva/corneas clear, EOM's intact       Lungs:     Clear to auscultation bilaterally, respirations unlabored  Heart:    Regular rate and rhythm  Neurologic:   Awake, alert, oriented x 3. No apparent focal neurological           defect.         UC Treatments / Results  Labs (all labs ordered are listed, but only abnormal results are displayed) Labs Reviewed  POCT RAPID STREP A (OFFICE)    EKG   Radiology No results found.  Procedures Procedures (including critical care time)  Medications Ordered in UC Medications - No data to display  Initial Impression / Assessment and Plan / UC Course  I have reviewed the triage vital signs and the nursing notes.  Pertinent labs & imaging results that were available during my care of the patient were reviewed by me and considered in my medical decision making (see chart for details).    Acute Pharyngitis , rapid strep negative, treating based on exam findings Amoxicillin 875 mg twice daily x 10 days Tylenol for acute pain and or fever Hydrate well with fluids RTC PRN  Final Clinical Impressions(s) / UC Diagnoses   Final diagnoses:  Acute pharyngitis, unspecified etiology   Discharge Instructions   None    ED Prescriptions     Medication Sig Dispense Auth. Provider   amoxicillin (AMOXIL) 875 MG tablet Take 1 tablet (875 mg total) by mouth 2 (two) times daily. 20 tablet , FNP      PDMP not reviewed this encounter.   09/07/2021, FNP 09/19/21 1334

## 2022-01-02 ENCOUNTER — Other Ambulatory Visit (HOSPITAL_COMMUNITY)
Admission: RE | Admit: 2022-01-02 | Discharge: 2022-01-02 | Disposition: A | Discharge status: Home / Self Care | Payer: Commercial Managed Care - HMO | Source: Ambulatory Visit | Attending: Family Medicine | Admitting: Family Medicine

## 2022-01-02 ENCOUNTER — Encounter: Payer: Self-pay | Admitting: Family Medicine

## 2022-01-02 ENCOUNTER — Ambulatory Visit (INDEPENDENT_AMBULATORY_CARE_PROVIDER_SITE_OTHER): Payer: Medicaid Other | Admitting: Family Medicine

## 2022-01-02 VITALS — BP 135/84 | HR 74 | Ht 62.0 in | Wt 172.0 lb

## 2022-01-02 DIAGNOSIS — Z30011 Encounter for initial prescription of contraceptive pills: Secondary | ICD-10-CM

## 2022-01-02 DIAGNOSIS — R102 Pelvic and perineal pain: Secondary | ICD-10-CM

## 2022-01-02 DIAGNOSIS — Z01419 Encounter for gynecological examination (general) (routine) without abnormal findings: Secondary | ICD-10-CM | POA: Diagnosis not present

## 2022-01-02 DIAGNOSIS — Z113 Encounter for screening for infections with a predominantly sexual mode of transmission: Secondary | ICD-10-CM | POA: Diagnosis not present

## 2022-01-02 MED ORDER — NORETHINDRONE 0.35 MG PO TABS: 1.0000 | ORAL_TABLET | Freq: Every day | ORAL | 4 refills | Status: DC

## 2022-01-02 NOTE — Progress Notes (Signed)
   GYNECOLOGY ANNUAL PREVENTATIVE CARE ENCOUNTER NOTE  Subjective:   Veronica [name] is a 26 y.o. G60P1001 female here for a routine annual gynecologic exam.  Current complaints: pelvic pain on slynd, stopped and has improved.  Wants methods, not planning pregnancy. She has history of migraines.. Does reports some vaginal irritation/odor that is mild. Denies abnormal vaginal bleeding, discharge, pelvic pain, problems with intercourse or other gynecologic concerns.    Gynecologic History Patient's last menstrual period was 12/24/2021 (approximate). Contraception: oral progesterone-only contraceptive Last Pap: 2022. Results were: normal Last mammogram: NA.   Health Maintenance Due  Topic Date Due   COVID-19 Vaccine (1) Never done   Hepatitis C Screening  Never done   INFLUENZA VACCINE  11/15/2021    The following portions of the patient's history were reviewed and updated as appropriate: allergies, current medications, past family history, past medical history, past social history, past surgical history and problem list.  Review of Systems Pertinent items are noted in HPI.   Objective:  BP 135/84   Pulse 74   Ht 5\' 2"  (1.575 m)   Wt 172 lb (78 kg)   LMP 12/24/2021 (Approximate)   BMI 31.46 kg/m  CONSTITUTIONAL: Well-developed, well-nourished female in no acute distress.  HENT:  Normocephalic, atraumatic, External right and left ear normal. Oropharynx is clear and moist EYES:  No scleral icterus.  NECK: Normal range of motion, supple, no masses.  Normal thyroid.  SKIN: Skin is warm and dry. No rash noted. Not diaphoretic. No erythema. No pallor. NEUROLOGIC: Alert and oriented to person, place, and time. Normal reflexes, muscle tone coordination. No cranial nerve deficit noted. PSYCHIATRIC: Normal mood and affect. Normal behavior. Normal judgment and thought content. CARDIOVASCULAR: Normal heart rate noted, regular rhythm. 2+ distal pulses. RESPIRATORY: Effort and breath sounds  normal, no problems with respiration noted. BREASTS: Symmetric in size. No masses, skin changes, nipple drainage, or lymphadenopathy. ABDOMEN: Soft,  no distention noted.  No tenderness, rebound or guarding.  PELVIC: Deferred/Declined MUSCULOSKELETAL: Normal range of motion.    Assessment and Plan:  1) Annual gynecologic examination:  repeat pap in 2025. Desires GC/CT screening.Ordered today.  Routine preventative health maintenance measures emphasized.  2) Contraception counseling: Reviewed all forms of birth control options available including abstinence; over the counter/barrier methods; hormonal contraceptive medication including pill, patch, ring, injection,contraceptive implant; hormonal and nonhormonal IUDs; permanent sterilization options including vasectomy and the various tubal sterilization modalities. Risks and benefits reviewed.  Questions were answered.  Written information was also given to the patient to review.  Patient desires POP, this was prescribed for patient. She will follow up in  1year for surveillance.  She was possibly interested in IUD if POP does not work for her  She was told to call with any further questions, or with any concerns about this method of contraception.  Emphasized use of condoms 100% of the time for STI prevention.  1. Screening examination for venereal disease - Cervicovaginal ancillary only  2. Well woman exam with routine gynecological exam CBE WNL  3. Encounter for initial prescription of contraceptive pills - norethindrone (MICRONOR) 0.35 MG tablet; Take 1 tablet (0.35 mg total) by mouth daily.  Dispense: 84 tablet; Refill: 4   Please refer to After Visit Summary for other counseling recommendations.   No follow-ups on file.  Caren Macadam, MD, MPH, ABFM Attending Physician Center for [facility]

## 2022-01-02 NOTE — Progress Notes (Signed)
Stopped slynd due to pelvic pressure, it did get better after stopping it.   Would like to try another OCP

## 2022-01-04 LAB — URINE CYTOLOGY ANCILLARY ONLY
Chlamydia: NEGATIVE
Comment: NEGATIVE
Comment: NORMAL
Neisseria Gonorrhea: NEGATIVE

## 2022-02-21 ENCOUNTER — Telehealth: Payer: Medicaid Other | Admitting: Physician Assistant

## 2022-02-21 DIAGNOSIS — B3731 Acute candidiasis of vulva and vagina: Secondary | ICD-10-CM | POA: Diagnosis not present

## 2022-02-21 MED ORDER — FLUCONAZOLE 150 MG PO TABS: 150.0000 mg | ORAL_TABLET | Freq: Once | ORAL | 0 refills | Status: AC

## 2022-02-21 NOTE — Progress Notes (Signed)

## 2022-02-21 NOTE — Progress Notes (Signed)
I have spent 5 minutes in review of e-visit questionnaire, review and updating patient chart, medical decision making and response to patient.   Neill Jurewicz Cody Kerin Kren, PA-C    

## 2022-03-17 ENCOUNTER — Ambulatory Visit
Admission: EM | Admit: 2022-03-17 | Discharge: 2022-03-17 | Disposition: A | Discharge status: Home / Self Care | Payer: Medicaid Other | Attending: Family Medicine | Admitting: Family Medicine

## 2022-03-17 ENCOUNTER — Encounter: Payer: Self-pay | Admitting: Emergency Medicine

## 2022-03-17 ENCOUNTER — Ambulatory Visit (HOSPITAL_COMMUNITY): Admit: 2022-03-17 | Payer: Medicaid Other

## 2022-03-17 DIAGNOSIS — Z1152 Encounter for screening for COVID-19: Secondary | ICD-10-CM | POA: Diagnosis not present

## 2022-03-17 DIAGNOSIS — R110 Nausea: Secondary | ICD-10-CM

## 2022-03-17 DIAGNOSIS — R1030 Lower abdominal pain, unspecified: Secondary | ICD-10-CM | POA: Diagnosis not present

## 2022-03-17 LAB — RESP PANEL BY RT-PCR (FLU A&B, COVID) ARPGX2
Influenza A by PCR: NEGATIVE
Influenza B by PCR: NEGATIVE
SARS Coronavirus 2 by RT PCR: NEGATIVE

## 2022-03-17 MED ORDER — ONDANSETRON 8 MG PO TBDP: 8.0000 mg | ORAL_TABLET | Freq: Three times a day (TID) | ORAL | 0 refills | Status: DC | PRN

## 2022-03-17 NOTE — ED Provider Notes (Signed)
[facility]    CSN: [id number] Arrival date & time: 03/17/22  1131      History   Chief Complaint Chief Complaint  Patient presents with   Abdominal Pain    HPI Veronica [name] is a 26 y.o. female.   HPI 26 year old female presents with lower abdominal pain/cramping x 2 weeks on and off.  Patient began her menstrual cycle yesterday and says the pain has become worse.  Patient denies any urinary symptoms.  PMH significant for obesity, anemia, and asthma.  Past Medical History:  Diagnosis Date   Acne    Anemia    Asthma    Childhood    Patient Active Problem List   Diagnosis Date Noted   Obesity (BMI 30-39.9) 06/23/2020   S/P cesarean section 11/10/2012   BV (bacterial vaginosis) 07/12/2012    Past Surgical History:  Procedure Laterality Date   CESAREAN SECTION N/A 11/10/2012   Procedure: Primary cesarean section with delivery of baby girl at 0731. Apgars 8/9.;  Surgeon: Tereso Newcomer, MD;  Location: [facility];  Service: Obstetrics;  Laterality: N/A;   CESAREAN SECTION     head surgery     Top of head epidermal but put to sleep    OB History     Gravida  1   Para  1   Term  1   Preterm      AB      Living  1      SAB      IAB      Ectopic      Multiple      Live Births  1            Home Medications    Prior to Admission medications   Medication Sig Start Date End Date Taking? Authorizing Provider  ondansetron (ZOFRAN-ODT) 8 MG disintegrating tablet Take 1 tablet (8 mg total) by mouth every 8 (eight) hours as needed for nausea or vomiting. 03/17/22  Yes Trevor Iha, FNP  phentermine (ADIPEX-P) 37.5 MG tablet Take by mouth. 12/04/20  Yes [provider]  norethindrone (MICRONOR) 0.35 MG tablet Take 1 tablet (0.35 mg total) by mouth daily. 01/02/22   Federico Flake, MD    Family History Family History  Problem Relation Age of Onset   Hypertension Mother    Healthy Father    Hyperlipidemia Maternal  Grandmother    Hypertension Maternal Grandmother    Diabetes Paternal Grandmother    Lung cancer Paternal Grandfather    Cancer Paternal Uncle     Social History Social History   Tobacco Use   Smoking status: Never   Smokeless tobacco: Never  Vaping Use   Vaping Use: Never used  Substance Use Topics   Alcohol use: Yes    Comment: Socially   Drug use: No     Allergies   Sulfa antibiotics   Review of Systems Review of Systems  Gastrointestinal:  Positive for abdominal pain.  All other systems reviewed and are negative.    Physical Exam Triage Vital Signs ED Triage Vitals  Enc Vitals Group     BP 03/17/22 1242 129/85     Pulse Rate 03/17/22 1242 68     Resp 03/17/22 1242 18     Temp 03/17/22 1242 99.3 F (37.4 C)     Temp Source 03/17/22 1242 Oral     SpO2 03/17/22 1242 100 %     Weight 03/17/22 1243 170 lb (77.1 kg)  Height 03/17/22 1243 5\' 2"  (1.575 m)     Head Circumference --      Peak Flow --      Pain Score 03/17/22 1243 4     Pain Loc --      Pain Edu? --      Excl. in GC? --    No data found.  Updated Vital Signs BP 129/85 (BP Location: Right Arm)   Pulse 68   Temp 99.3 F (37.4 C) (Oral)   Resp 18   Ht 5\' 2"  (1.575 m)   Wt 170 lb (77.1 kg)   LMP 03/16/2022 (Exact Date)   SpO2 100%   BMI 31.09 kg/m      Physical Exam Vitals reviewed.  Constitutional:      Appearance: She is well-developed. She is obese. She is not ill-appearing.  HENT:     Head: Normocephalic and atraumatic.     Mouth/Throat:     Mouth: Mucous membranes are moist.     Pharynx: Oropharynx is clear.  Eyes:     Extraocular Movements: Extraocular movements intact.     Pupils: Pupils are equal, round, and reactive to light.  Cardiovascular:     Rate and Rhythm: Normal rate and regular rhythm.     Heart sounds: Normal heart sounds. No murmur heard. Pulmonary:     Effort: Pulmonary effort is normal.     Breath sounds: Normal breath sounds.  Abdominal:      General: Abdomen is flat. Bowel sounds are absent. There is no distension or abdominal bruit.     Palpations: Abdomen is soft.     Tenderness: There is abdominal tenderness in the right lower quadrant, suprapubic area and left lower quadrant. There is no right CVA tenderness, left CVA tenderness, guarding or rebound. Negative signs include Murphy's sign and Rovsing's sign.     Hernia: No hernia is present.  Neurological:     Mental Status: She is alert.      UC Treatments / Results  Labs (all labs ordered are listed, but only abnormal results are displayed) Labs Reviewed  RESP PANEL BY RT-PCR (FLU A&B, COVID) [id number]    EKG   Radiology No results found.  Procedures Procedures (including critical care time)  Medications Ordered in UC Medications - No data to display  Initial Impression / Assessment and Plan / UC Course  I have reviewed the triage vital signs and the nursing notes.  Pertinent labs & imaging results that were available during my care of the patient were reviewed by me and considered in my medical decision making (see chart for details).     MDM: 1.  Lower abdominal pain-lab ordered; 2.  Nausea-Rx'd Zofran. Advised patient lower abdominal pain most likely due to menstrual cramping now that she is stopped her birth control.  Advised may take Zofran daily as needed for nausea.  Advised if lower abdominal pain worsens please go to nearest ED for further evaluation.  Advised we will follow-up with COVID-19/influenza results once received.  Work note provided to patient prior to discharge per patient request. Final Clinical Impressions(s) / UC Diagnoses   Final diagnoses:  Lower abdominal pain  Nausea     Discharge Instructions      Advised patient lower abdominal pain most likely due to menstrual cramping now that she is stopped her birth control.  Advised may take Zofran daily as needed for nausea.  Advised if lower abdominal pain worsens please go to  nearest ED for  further evaluation.  Advised we will follow-up with COVID-19/influenza results once received.     ED Prescriptions     Medication Sig Dispense Auth. Provider   ondansetron (ZOFRAN-ODT) 8 MG disintegrating tablet Take 1 tablet (8 mg total) by mouth every 8 (eight) hours as needed for nausea or vomiting. 24 tablet Trevor Iha, FNP      PDMP not reviewed this encounter.   Trevor Iha, FNP 03/17/22 1355

## 2022-03-17 NOTE — ED Triage Notes (Signed)
Patient c/o lower abdominal pain/cramping x 2 weeks off and on.  Patient did start her cycle yesterday and pain became worse this morning.  Patient denies any OTC pain meds.  Denies UTI sx's.

## 2022-03-17 NOTE — Discharge Instructions (Addendum)
Advised patient lower abdominal pain most likely due to menstrual cramping now that she is stopped her birth control.  Advised may take Zofran daily as needed for nausea.  Advised if lower abdominal pain worsens please go to nearest ED for further evaluation.  Advised we will follow-up with COVID-19/influenza results once received.

## 2022-03-18 ENCOUNTER — Telehealth: Payer: Self-pay

## 2022-03-18 NOTE — Telephone Encounter (Signed)
Called pt to follow up regarding recent urgent care visit. Pt was unavailable on attempt to contact. Left voice message instructing pt to call back with any questions or concerns regarding the visit.

## 2022-06-05 ENCOUNTER — Ambulatory Visit
Admission: RE | Admit: 2022-06-05 | Discharge: 2022-06-05 | Disposition: A | Discharge status: Home / Self Care | Payer: BLUE CROSS/BLUE SHIELD | Source: Ambulatory Visit | Attending: Emergency Medicine | Admitting: Emergency Medicine

## 2022-06-05 VITALS — BP 121/85 | HR 82 | Temp 98.7°F | Resp 20 | Ht 62.0 in | Wt 168.0 lb

## 2022-06-05 DIAGNOSIS — J00 Acute nasopharyngitis [common cold]: Secondary | ICD-10-CM | POA: Diagnosis not present

## 2022-06-05 DIAGNOSIS — J069 Acute upper respiratory infection, unspecified: Secondary | ICD-10-CM

## 2022-06-05 LAB — POCT INFLUENZA A/B
Influenza A, POC: NEGATIVE
Influenza B, POC: NEGATIVE

## 2022-06-05 LAB — POCT RAPID STREP A (OFFICE): Rapid Strep A Screen: NEGATIVE

## 2022-06-05 LAB — POC SARS CORONAVIRUS 2 AG -  ED: SARS Coronavirus 2 Ag: NEGATIVE

## 2022-06-05 MED ORDER — IBUPROFEN 400 MG PO TABS: 400.0000 mg | ORAL_TABLET | Freq: Three times a day (TID) | ORAL | 0 refills | Status: AC | PRN

## 2022-06-05 MED ORDER — ACETAMINOPHEN 325 MG PO TABS
650.0000 mg | ORAL_TABLET | Freq: Once | ORAL | Status: AC
  Administered 2022-06-05: 650 mg via ORAL

## 2022-06-05 MED ORDER — CETIRIZINE HCL 10 MG PO TABS: 10.0000 mg | ORAL_TABLET | Freq: Every day | ORAL | 1 refills | Status: AC

## 2022-06-05 MED ORDER — FLUTICASONE PROPIONATE 50 MCG/ACT NA SUSP: 1.0000 | Freq: Every day | NASAL | 2 refills | Status: AC

## 2022-06-05 NOTE — ED Triage Notes (Signed)
Pt presents to Urgent Care with c/o sore throat and mild cough x 2 days, fever at onset. States she also had intermittent N/V 2 days ago. Now c/o sob, fatigue, and pain in rib area. Negative home COVID test yesterday. Exposed to RSV.

## 2022-06-05 NOTE — Discharge Instructions (Signed)
Your rapid influenza antigen test today was negative.  No further influenza testing is indicated.     Your COVID-19 PCR test is negative.  Please consider retesting in the next 2 to 3 days, particularly if you are not feeling any better.  You are welcome to return here to urgent care to have it done or you can take a home COVID-19 test.    If both your COVID-19 tests are negative, then you can safely assume that your illness is due to one of the many less serious illnesses circulating in our community right now.     Conservative care is recommended with rest, drinking plenty of clear fluids, eating only when hungry, taking supportive medications for your symptoms and avoiding being around other people.  Please remain at home until you are fever free for 24 hours without the use of antifever medications such as Tylenol and ibuprofen.   Your strep test today is negative.  Streptococcal throat culture will be performed per our protocol.  The result of your throat culture will be posted to your MyChart once it is complete, this typically takes 3 to 5 days.  If your streptococcal throat culture is positive, you will be contacted by phone and antibiotics will prescribed for you.   Please read below to learn more about the medications, dosages and frequencies that I recommend to help alleviate your symptoms and to get you feeling better soon:   Zyrtec (cetirizine): This is an excellent second-generation antihistamine that helps to reduce respiratory inflammatory response to environmental allergens.  In some patients, this medication can cause daytime sleepiness so I recommend that you take 1 tablet daily at bedtime.     Flonase (fluticasone): This is a steroid nasal spray that used once daily, 1 spray in each nare.  This works best when used on a daily basis. This medication does not work well if it is only used when you think you need it.  After 3 to 5 days of use, you will notice significant reduction of  the inflammation and mucus production that is currently being caused by exposure to allergens, whether seasonal or environmental.  The most common side effect of this medication is nosebleeds.  If you experience a nosebleed, please discontinue use for 1 week, then feel free to resume.  If you find that your insurance will not pay for this medication, please consider a different nasal steroids such as Nasonex (mometasone), or Nasacort (triamcinolone).    Advil, Motrin (ibuprofen): This is a good anti-inflammatory medication which addresses aches, pains and inflammation of the upper airways that causes sinus and nasal congestion as well as in the lower airways which makes your cough feel tight and sometimes burn.  I recommend that you take between 400 to 600 mg every 6-8 hours as needed.      Robitussin, Mucinex (guaifenesin): This is an expectorant.  This helps break up chest congestion and loosen up thick nasal drainage making phlegm and drainage more liquid and therefore easier to remove.  I recommend being 400 mg three times daily as needed.      Please follow-up within the next 5-7 days either with your primary care provider or urgent care if your symptoms do not resolve.  If you do not have a primary care provider, we will assist you in finding one.        Thank you for visiting urgent care today.  We appreciate the opportunity to participate in your care.

## 2022-06-05 NOTE — ED Provider Notes (Signed)
[name]    CSN: [id number] Arrival date & time: 06/05/22  1907    HISTORY   Chief Complaint  Patient presents with   Sore Throat   Headache   Shortness of Breath   HPI Veronica [name] is a pleasant, 27 y.o. female who presents to urgent care today. Pt c/o sore throat and mild, intermittent, nonproductive cough x 2 days with tactile fever at onset of symptoms. States she also had intermittent N/V 2 days ago which has now resolved. Today, has been having sob, fatigue, and pain in rib area.  Patient reports negative home COVID-19 test yesterday as well as exposure to RSV.  Of note, patient has normal vital signs on arrival today.  The history is provided by the patient.   Past Medical History:  Diagnosis Date   Acne    Anemia    Asthma    Childhood   Patient Active Problem List   Diagnosis Date Noted   Obesity (BMI 30-39.9) 06/23/2020   S/P cesarean section 11/10/2012   BV (bacterial vaginosis) 07/12/2012   Past Surgical History:  Procedure Laterality Date   CESAREAN SECTION N/A 11/10/2012   Procedure: Primary cesarean section with delivery of baby girl at 0731. Apgars 8/9.;  Surgeon: Osborne Oman, MD;  Location: [facility];  Service: Obstetrics;  Laterality: N/A;   CESAREAN SECTION     head surgery     Top of head epidermal but put to sleep   OB History     Gravida  1   Para  1   Term  1   Preterm      AB      Living  1      SAB      IAB      Ectopic      Multiple      Live Births  1          Home Medications    Prior to Admission medications   Medication Sig Start Date End Date Taking? Authorizing Provider  norethindrone (MICRONOR) 0.35 MG tablet Take 1 tablet (0.35 mg total) by mouth daily. 01/02/22   Caren Macadam, MD  ondansetron (ZOFRAN-ODT) 8 MG disintegrating tablet Take 1 tablet (8 mg total) by mouth every 8 (eight) hours as needed for nausea or vomiting. 03/17/22   Eliezer Lofts, FNP  phentermine  (ADIPEX-P) 37.5 MG tablet Take by mouth. 12/04/20   [provider]    Family History Family History  Problem Relation Age of Onset   Hypertension Mother    Healthy Father    Hyperlipidemia Maternal Grandmother    Hypertension Maternal Grandmother    Diabetes Paternal Grandmother    Lung cancer Paternal Grandfather    Cancer Paternal Uncle    Social History Social History   Tobacco Use   Smoking status: Never   Smokeless tobacco: Never  Vaping Use   Vaping Use: Never used  Substance Use Topics   Alcohol use: Yes    Comment: Socially   Drug use: No   Allergies   Sulfa antibiotics  Review of Systems Review of Systems Pertinent findings revealed after performing a 14 point review of systems has been noted in the history of present illness.  Physical Exam Vital Signs BP 121/85   Pulse 82   Temp 98.7 F (37.1 C) (Oral)   Resp 20   Ht 5' 2"$  (1.575 m)   Wt 168 lb (76.2 kg)   LMP 05/12/2022 (Exact  Date)   SpO2 100%   BMI 30.73 kg/m   No data found.  Physical Exam Vitals and nursing note reviewed.  Constitutional:      General: She is not in acute distress.    Appearance: Normal appearance. She is not ill-appearing.  HENT:     Head: Normocephalic and atraumatic.     Salivary Glands: Right salivary gland is not diffusely enlarged or tender. Left salivary gland is not diffusely enlarged or tender.     Right Ear: Ear canal and external ear normal. No drainage. A middle ear effusion is present. There is no impacted cerumen. Tympanic membrane is bulging. Tympanic membrane is not injected or erythematous.     Left Ear: Ear canal and external ear normal. No drainage. A middle ear effusion is present. There is no impacted cerumen. Tympanic membrane is bulging. Tympanic membrane is not injected or erythematous.     Ears:     Comments: Bilateral EACs normal, both TMs bulging with clear fluid    Nose: Rhinorrhea present. No nasal deformity, septal deviation, signs of  injury, nasal tenderness, mucosal edema or congestion. Rhinorrhea is clear.     Right Nostril: Occlusion present. No foreign body, epistaxis or septal hematoma.     Left Nostril: Occlusion present. No foreign body, epistaxis or septal hematoma.     Right Turbinates: Enlarged, swollen and pale.     Left Turbinates: Enlarged, swollen and pale.     Right Sinus: No maxillary sinus tenderness or frontal sinus tenderness.     Left Sinus: No maxillary sinus tenderness or frontal sinus tenderness.     Mouth/Throat:     Lips: Pink. No lesions.     Mouth: Mucous membranes are moist. No oral lesions.     Pharynx: Oropharynx is clear. Uvula midline. No posterior oropharyngeal erythema or uvula swelling.     Tonsils: No tonsillar exudate. 0 on the right. 0 on the left.     Comments: Postnasal drip Eyes:     General: Lids are normal.        Right eye: No discharge.        Left eye: No discharge.     Extraocular Movements: Extraocular movements intact.     Conjunctiva/sclera: Conjunctivae normal.     Right eye: Right conjunctiva is not injected.     Left eye: Left conjunctiva is not injected.  Neck:     Trachea: Trachea and phonation normal.  Cardiovascular:     Rate and Rhythm: Normal rate and regular rhythm.     Pulses: Normal pulses.     Heart sounds: Normal heart sounds. No murmur heard.    No friction rub. No gallop.  Pulmonary:     Effort: Pulmonary effort is normal. No accessory muscle usage, prolonged expiration or respiratory distress.     Breath sounds: Normal breath sounds. No stridor, decreased air movement or transmitted upper airway sounds. No decreased breath sounds, wheezing, rhonchi or rales.  Chest:     Chest wall: No tenderness.  Musculoskeletal:        General: Normal range of motion.     Cervical back: Normal range of motion and neck supple. Normal range of motion.  Lymphadenopathy:     Cervical: No cervical adenopathy.  Skin:    General: Skin is warm and dry.      Findings: No erythema or rash.  Neurological:     General: No focal deficit present.     Mental Status: She is alert and  oriented to person, place, and time.  Psychiatric:        Mood and Affect: Mood normal.        Behavior: Behavior normal.     Visual Acuity Right Eye Distance:   Left Eye Distance:   Bilateral Distance:    Right Eye Near:   Left Eye Near:    Bilateral Near:     UC Couse / Diagnostics / Procedures:     Radiology No results found.  Procedures Procedures (including critical care time) EKG  Pending results:  Labs Reviewed  POC SARS CORONAVIRUS 2 AG -  ED  POCT INFLUENZA A/B  POCT RAPID STREP A (OFFICE)    Medications Ordered in UC: Medications  acetaminophen (TYLENOL) tablet 650 mg (650 mg Oral Given 06/05/22 1942)    UC Diagnoses / Final Clinical Impressions(s)   I have reviewed the triage vital signs and the nursing notes.  Pertinent labs & imaging results that were available during my care of the patient were reviewed by me and considered in my medical decision making (see chart for details).    Final diagnoses:  Acute rhinitis  Viral upper respiratory tract infection   *** Please see discharge instructions below for further details of plan of care as provided to patient. ED Prescriptions     Medication Sig Dispense Auth. Provider   cetirizine (ZYRTEC ALLERGY) 10 MG tablet Take 1 tablet (10 mg total) by mouth at bedtime. 90 tablet Lynden Oxford Scales, PA-C   fluticasone (FLONASE) 50 MCG/ACT nasal spray Place 1 spray into both nostrils daily. Begin by using 2 sprays in each nare daily for 3 to 5 days, then decrease to 1 spray in each nare daily. 15.8 mL Lynden Oxford Scales, PA-C   ibuprofen (ADVIL) 400 MG tablet Take 1 tablet (400 mg total) by mouth every 8 (eight) hours as needed for up to 30 doses. 30 tablet Lynden Oxford Scales, PA-C      PDMP not reviewed this encounter.  Disposition Upon Discharge:  Condition: stable for  discharge home Home: take medications as prescribed; routine discharge instructions as discussed; follow up as advised.  Patient presented with an acute illness with associated systemic symptoms and significant discomfort requiring urgent management. In my opinion, this is a condition that a prudent lay person (someone who possesses an average knowledge of health and medicine) may potentially expect to result in complications if not addressed urgently such as respiratory distress, impairment of bodily function or dysfunction of bodily organs.   Routine symptom specific, illness specific and/or disease specific instructions were discussed with the patient and/or caregiver at length.   As such, the patient has been evaluated and assessed, work-up was performed and treatment was provided in alignment with urgent care protocols and evidence based medicine.  Patient/parent/caregiver has been advised that the patient may require follow up for further testing and treatment if the symptoms continue in spite of treatment, as clinically indicated and appropriate.  If the patient was tested for COVID-19, Influenza and/or RSV, then the patient/parent/guardian was advised to isolate at home pending the results of his/her diagnostic coronavirus test and potentially longer if they're positive. I have also advised pt that if his/her COVID-19 test returns positive, it's recommended to self-isolate for at least 10 days after symptoms first appeared AND until fever-free for 24 hours without fever reducer AND other symptoms have improved or resolved. Discussed self-isolation recommendations as well as instructions for household member/close contacts as per the CDC and [place] DHHS,  and also gave patient the COVID packet with this information.  Patient/parent/caregiver has been advised to return to the [facility] or PCP in 3-5 days if no better; to PCP or the Emergency Department if new signs and symptoms develop, or if the current signs  or symptoms continue to change or worsen for further workup, evaluation and treatment as clinically indicated and appropriate  The patient will follow up with their current PCP if and as advised. If the patient does not currently have a PCP we will assist them in obtaining one.   The patient may need specialty follow up if the symptoms continue, in spite of conservative treatment and management, for further workup, evaluation, consultation and treatment as clinically indicated and appropriate.  Patient/parent/caregiver verbalized understanding and agreement of plan as discussed.  All questions were addressed during visit.  Please see discharge instructions below for further details of plan.  Discharge Instructions:   Discharge Instructions      Your rapid influenza antigen test today was negative.  No further influenza testing is indicated.     Your COVID-19 PCR test is negative.  Please consider retesting in the next 2 to 3 days, particularly if you are not feeling any better.  You are welcome to return here to urgent care to have it done or you can take a home COVID-19 test.    If both your COVID-19 tests are negative, then you can safely assume that your illness is due to one of the many less serious illnesses circulating in our community right now.     Conservative care is recommended with rest, drinking plenty of clear fluids, eating only when hungry, taking supportive medications for your symptoms and avoiding being around other people.  Please remain at home until you are fever free for 24 hours without the use of antifever medications such as Tylenol and ibuprofen.   Your strep test today is negative.  Streptococcal throat culture will be performed per our protocol.  The result of your throat culture will be posted to your MyChart once it is complete, this typically takes 3 to 5 days.  If your streptococcal throat culture is positive, you will be contacted by phone and antibiotics will  prescribed for you.   Please read below to learn more about the medications, dosages and frequencies that I recommend to help alleviate your symptoms and to get you feeling better soon:   Zyrtec (cetirizine): This is an excellent second-generation antihistamine that helps to reduce respiratory inflammatory response to environmental allergens.  In some patients, this medication can cause daytime sleepiness so I recommend that you take 1 tablet daily at bedtime.     Flonase (fluticasone): This is a steroid nasal spray that used once daily, 1 spray in each nare.  This works best when used on a daily basis. This medication does not work well if it is only used when you think you need it.  After 3 to 5 days of use, you will notice significant reduction of the inflammation and mucus production that is currently being caused by exposure to allergens, whether seasonal or environmental.  The most common side effect of this medication is nosebleeds.  If you experience a nosebleed, please discontinue use for 1 week, then feel free to resume.  If you find that your insurance will not pay for this medication, please consider a different nasal steroids such as Nasonex (mometasone), or Nasacort (triamcinolone).    Advil, Motrin (ibuprofen): This is a good anti-inflammatory medication  which addresses aches, pains and inflammation of the upper airways that causes sinus and nasal congestion as well as in the lower airways which makes your cough feel tight and sometimes burn.  I recommend that you take between 400 to 600 mg every 6-8 hours as needed.      Robitussin, Mucinex (guaifenesin): This is an expectorant.  This helps break up chest congestion and loosen up thick nasal drainage making phlegm and drainage more liquid and therefore easier to remove.  I recommend being 400 mg three times daily as needed.      Please follow-up within the next 5-7 days either with your primary care provider or urgent care if your  symptoms do not resolve.  If you do not have a primary care provider, we will assist you in finding one.        Thank you for visiting urgent care today.  We appreciate the opportunity to participate in your care.       This office note has been dictated using Museum/gallery curator.  Unfortunately, this method of dictation can sometimes lead to typographical or grammatical errors.  I apologize for your inconvenience in advance if this occurs.  Please do not hesitate to reach out to me if clarification is needed.

## 2022-06-08 LAB — CULTURE, GROUP A STREP (THRC)

## 2022-07-04 ENCOUNTER — Telehealth: Payer: BLUE CROSS/BLUE SHIELD | Admitting: Physician Assistant

## 2022-07-04 DIAGNOSIS — B3731 Acute candidiasis of vulva and vagina: Secondary | ICD-10-CM

## 2022-07-04 MED ORDER — FLUCONAZOLE 150 MG PO TABS: 150.0000 mg | ORAL_TABLET | Freq: Once | ORAL | 0 refills | Status: AC

## 2022-07-04 NOTE — Progress Notes (Signed)
I have spent 5 minutes in review of e-visit questionnaire, review and updating patient chart, medical decision making and response to patient.   Mikiah Demond Cody Polk Minor, PA-C    

## 2022-07-04 NOTE — Progress Notes (Signed)

## 2022-07-04 NOTE — Progress Notes (Signed)
Message sent to patient requesting further input regarding current symptoms. Awaiting patient response.  

## 2022-08-23 ENCOUNTER — Encounter: Payer: Self-pay | Admitting: Family Medicine

## 2022-09-06 ENCOUNTER — Telehealth: Payer: Self-pay | Admitting: *Deleted

## 2022-09-06 NOTE — Telephone Encounter (Signed)
Attempted to call pt to schedule OB interview, left message for pt to call back or reply to my MyChart message.

## 2022-09-08 ENCOUNTER — Other Ambulatory Visit: Payer: BLUE CROSS/BLUE SHIELD

## 2022-09-08 DIAGNOSIS — Z3200 Encounter for pregnancy test, result unknown: Secondary | ICD-10-CM

## 2022-09-09 LAB — BETA HCG QUANT (REF LAB): hCG Quant: <1 m[IU]/mL

## 2022-10-06 ENCOUNTER — Encounter: Payer: BLUE CROSS/BLUE SHIELD | Admitting: Family Medicine

## 2022-10-23 ENCOUNTER — Ambulatory Visit
Admission: EM | Admit: 2022-10-23 | Discharge: 2022-10-23 | Disposition: A | Discharge status: Home / Self Care | Payer: BLUE CROSS/BLUE SHIELD

## 2022-10-23 DIAGNOSIS — M79609 Pain in unspecified limb: Secondary | ICD-10-CM | POA: Diagnosis not present

## 2022-10-23 DIAGNOSIS — M79662 Pain in left lower leg: Secondary | ICD-10-CM

## 2022-10-23 NOTE — Discharge Instructions (Addendum)
Please go to the Vein and Vascular center tomorrow morning at 11 am (Entrance C of [facility] hospital) Tell them you are for a vascular study  In the meantime continue ibuprofen, rest, ice, and elevate  Please go to the emergency department if symptoms worsen.

## 2022-10-23 NOTE — ED Provider Notes (Signed)
[facility]    CSN: [id number] Arrival date & time: 10/23/22  1820     History   Chief Complaint Chief Complaint  Patient presents with   Leg Swelling    I'm having pain in the back of my leg for the last 3 days - Entered by patient   Leg Pain    HPI Veronica [name] is a 27 y.o. female.  3 day history of pain and swelling behind the left knee Rates pain 5/10 Today it started radiating down posterior calf Denies warmth, redness, skin changes Denies recent injury or trauma. No recent hospitalization, immobilization, long stationary trips. No history of DVT. Has used POPs but no estrogen   Works in [place] and stands for long periods of time   Tried ibuprofen with temporary help   Past Medical History:  Diagnosis Date   Acne    Anemia    Asthma    Childhood    Patient Active Problem List   Diagnosis Date Noted   Obesity (BMI 30-39.9) 06/23/2020   S/P cesarean section 11/10/2012   BV (bacterial vaginosis) 07/12/2012    Past Surgical History:  Procedure Laterality Date   CESAREAN SECTION N/A 11/10/2012   Procedure: Primary cesarean section with delivery of baby girl at 0731. Apgars 8/9.;  Surgeon: Tereso Newcomer, MD;  Location: [facility];  Service: Obstetrics;  Laterality: N/A;   CESAREAN SECTION     head surgery     Top of head epidermal but put to sleep    OB History     Gravida  1   Para  1   Term  1   Preterm      AB      Living  1      SAB      IAB      Ectopic      Multiple      Live Births  1            Home Medications    Prior to Admission medications   Medication Sig Start Date End Date Taking? Authorizing Provider  phentermine (ADIPEX-P) 37.5 MG tablet Take 37.5 mg by mouth daily. 09/05/22  Yes [provider]  cetirizine (ZYRTEC ALLERGY) 10 MG tablet Take 1 tablet (10 mg total) by mouth at bedtime. 06/05/22 12/02/22  Theadora Rama Scales, PA-C  fluticasone (FLONASE) 50 MCG/ACT nasal spray Place 1 spray  into both nostrils daily. Begin by using 2 sprays in each nare daily for 3 to 5 days, then decrease to 1 spray in each nare daily. 06/05/22   Theadora Rama Scales, PA-C  ibuprofen (ADVIL) 400 MG tablet Take 1 tablet (400 mg total) by mouth every 8 (eight) hours as needed for up to 30 doses. 06/05/22   Theadora Rama Scales, PA-C    Family History Family History  Problem Relation Age of Onset   Hypertension Mother    Healthy Father    Hyperlipidemia Maternal Grandmother    Hypertension Maternal Grandmother    Diabetes Paternal Grandmother    Lung cancer Paternal Grandfather    Cancer Paternal Uncle     Social History Social History   Tobacco Use   Smoking status: Never   Smokeless tobacco: Never  Vaping Use   Vaping Use: Some days  Substance Use Topics   Alcohol use: Yes    Comment: Socially   Drug use: No     Allergies   Sulfa antibiotics   Review of Systems  Review of Systems Per HPI  Physical Exam Triage Vital Signs ED Triage Vitals  Enc Vitals Group     BP 10/23/22 1835 119/87     Pulse Rate 10/23/22 1835 87     Resp 10/23/22 1835 18     Temp 10/23/22 1835 98.8 F (37.1 C)     Temp Source 10/23/22 1835 Oral     SpO2 10/23/22 1835 96 %     Weight --      Height --      Head Circumference --      Peak Flow --      Pain Score 10/23/22 1832 5     Pain Loc --      Pain Edu? --      Excl. in GC? --    No data found.  Updated Vital Signs BP 119/87 (BP Location: Right Arm)   Pulse 87   Temp 98.8 F (37.1 C) (Oral)   Resp 18   LMP 10/09/2022 (Exact Date)   SpO2 96%    Physical Exam Vitals and nursing note reviewed.  Constitutional:      General: She is not in acute distress.    Appearance: She is not ill-appearing.  HENT:     Mouth/Throat:     Pharynx: Oropharynx is clear.  Cardiovascular:     Rate and Rhythm: Normal rate and regular rhythm.     Pulses: Normal pulses.     Heart sounds: Normal heart sounds.  Pulmonary:     Effort: Pulmonary  effort is normal.     Breath sounds: Normal breath sounds.  Musculoskeletal:        General: Tenderness present.     Cervical back: Normal range of motion.       Legs:     Comments: Tender from left popliteal through posterior calf. No obvious swelling or deformity. Full ROM of knee and ankle. Distal sensation intact. Strong DP pulse  Skin:    General: Skin is warm and dry.     Capillary Refill: Capillary refill takes less than 2 seconds.     Findings: No bruising or erythema.  Neurological:     Mental Status: She is alert and oriented to person, place, and time.      UC Treatments / Results  Labs (all labs ordered are listed, but only abnormal results are displayed) Labs Reviewed - No data to display  EKG  Radiology No results found.  Procedures Procedures (including critical care time)  Medications Ordered in UC Medications - No data to display  Initial Impression / Assessment and Plan / UC Course  I have reviewed the triage vital signs and the nursing notes.  Pertinent labs & imaging results that were available during my care of the patient were reviewed by me and considered in my medical decision making (see chart for details).  Ordered LE venous study for tomorrow morning 11 am Will be referred to DVT clinic if needed Otherwise consider muscular or nerve etiology Advised continue ibuprofen for pain, elevate leg and apply ice for symptomatic relief. Discussed going to ED if symptoms worsen overnight. Patient agreeable to plan, all questions answered   Final Clinical Impressions(s) / UC Diagnoses   Final diagnoses:  Pain of left calf  Popliteal pain     Discharge Instructions      Please go to the Vein and Vascular center tomorrow morning at 11 am (Entrance C of [facility]) Tell them you are for a vascular study  In the meantime continue ibuprofen, rest, ice, and elevate  Please go to the emergency department if symptoms worsen.     ED  Prescriptions   None    PDMP not reviewed this encounter.   Marlow Baars, [place] 10/24/22 [phone]
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
## 2022-10-24 ENCOUNTER — Ambulatory Visit (HOSPITAL_COMMUNITY)
Admission: RE | Admit: 2022-10-24 | Discharge: 2022-10-24 | Disposition: A | Discharge status: Home / Self Care | Payer: BLUE CROSS/BLUE SHIELD | Source: Ambulatory Visit | Attending: Emergency Medicine | Admitting: Emergency Medicine

## 2022-10-24 DIAGNOSIS — M79662 Pain in left lower leg: Secondary | ICD-10-CM | POA: Diagnosis not present

## 2023-11-20 ENCOUNTER — Emergency Department (HOSPITAL_BASED_OUTPATIENT_CLINIC_OR_DEPARTMENT_OTHER)
Admission: EM | Admit: 2023-11-20 | Discharge: 2023-11-20 | Disposition: A | Discharge status: Home / Self Care | Payer: PRIVATE HEALTH INSURANCE | Attending: Emergency Medicine | Admitting: Emergency Medicine

## 2023-11-20 ENCOUNTER — Encounter (HOSPITAL_BASED_OUTPATIENT_CLINIC_OR_DEPARTMENT_OTHER): Payer: Self-pay | Admitting: Emergency Medicine

## 2023-11-20 ENCOUNTER — Other Ambulatory Visit: Payer: Self-pay

## 2023-11-20 DIAGNOSIS — O99512 Diseases of the respiratory system complicating pregnancy, second trimester: Secondary | ICD-10-CM | POA: Diagnosis not present

## 2023-11-20 DIAGNOSIS — J45909 Unspecified asthma, uncomplicated: Secondary | ICD-10-CM | POA: Insufficient documentation

## 2023-11-20 DIAGNOSIS — J069 Acute upper respiratory infection, unspecified: Secondary | ICD-10-CM

## 2023-11-20 DIAGNOSIS — Z3A14 14 weeks gestation of pregnancy: Secondary | ICD-10-CM | POA: Diagnosis not present

## 2023-11-20 DIAGNOSIS — R059 Cough, unspecified: Secondary | ICD-10-CM | POA: Insufficient documentation

## 2023-11-20 DIAGNOSIS — O2692 Pregnancy related conditions, unspecified, second trimester: Secondary | ICD-10-CM | POA: Diagnosis present

## 2023-11-20 LAB — RESP PANEL BY RT-PCR (RSV, FLU A&B, COVID)  RVPGX2
Influenza A by PCR: NEGATIVE
Influenza B by PCR: NEGATIVE
Resp Syncytial Virus by PCR: NEGATIVE
SARS Coronavirus 2 by RT PCR: NEGATIVE

## 2023-11-20 LAB — GROUP A STREP BY PCR: Group A Strep by PCR: NOT DETECTED

## 2023-11-20 MED ORDER — ACETAMINOPHEN 325 MG PO TABS
650.0000 mg | ORAL_TABLET | Freq: Once | ORAL | Status: AC | PRN
  Administered 2023-11-20: 650 mg via ORAL
  Filled 2023-11-20: qty 2

## 2023-11-20 NOTE — ED Provider Notes (Signed)
 [place] [facility] Provider Note   CSN: [id number] Arrival date & time: 11/20/23  0227     Patient presents with: Sore Throat and Cough   Veronica [name] is a 28 y.o. female.   The history is provided by the patient.  Patient with history of childhood asthma presents for upper respiratory infection.  For the past 2 days she has had productive cough with yellow sputum, congestion sore throat and headache.  She has had mild bodyaches but no fevers.  No vomiting or diarrhea.  She is currently [redacted] weeks pregnant, but denies any abdominal pain, no vaginal bleeding No chest pain or shortness of breath    Past Medical History:  Diagnosis Date   Acne    Anemia    Asthma    Childhood    Prior to Admission medications   Medication Sig Start Date End Date Taking? Authorizing Provider  cetirizine  (ZYRTEC  ALLERGY) 10 MG tablet Take 1 tablet (10 mg total) by mouth at bedtime. 06/05/22 12/02/22  Joesph Shaver Scales, PA-C  fluticasone  (FLONASE ) 50 MCG/ACT nasal spray Place 1 spray into both nostrils daily. Begin by using 2 sprays in each nare daily for 3 to 5 days, then decrease to 1 spray in each nare daily. 06/05/22   Joesph Shaver Scales, PA-C  ibuprofen  (ADVIL ) 400 MG tablet Take 1 tablet (400 mg total) by mouth every 8 (eight) hours as needed for up to 30 doses. 06/05/22   Joesph Shaver Scales, PA-C  phentermine (ADIPEX-P) 37.5 MG tablet Take 37.5 mg by mouth daily. 09/05/22   [provider]    Allergies: Sulfa antibiotics    Review of Systems  Constitutional:  Positive for fatigue. Negative for fever.  Respiratory:  Positive for cough.     Updated Vital Signs BP 121/78   Pulse 84   Temp 98.5 F (36.9 C) (Oral)   Resp 15   Ht 1.575 m (5' 2)   Wt 81.6 kg   SpO2 100%   BMI 32.92 kg/m   Physical Exam CONSTITUTIONAL: Well developed/well nourished HEAD: Normocephalic/atraumatic EYES: EOMI/PERRL ENMT: Mucous membranes moist,  uvula midline, no erythema or disease, no stridor or drooling, no dysphonia NECK: supple no meningeal signs CV: S1/S2 noted, no murmurs/rubs/gallops noted LUNGS: Lungs are clear to auscultation bilaterally, no apparent distress NEURO: Pt is awake/alert/appropriate, moves all extremitiesx4.  No facial droop.   SKIN: warm, color normal PSYCH: no abnormalities of mood noted, alert and oriented to situation  (all labs ordered are listed, but only abnormal results are displayed) Labs Reviewed  RESP PANEL BY RT-PCR (RSV, FLU A&B, COVID)  [id number]  GROUP A STREP BY PCR    EKG: None  Radiology: No results found.   Procedures   Medications Ordered in the ED  acetaminophen  (TYLENOL ) tablet 650 mg (650 mg Oral Given 11/20/23 0247)                                    Medical Decision Making Risk OTC drugs.   Patient with likely viral URI.  Lung sounds are clear, low suspicion for pneumonia.  No indication for x-ray Strep panel and viral panel both negative Patient is in no acute distress Discussed appropriate use of OTC meds.  We discussed return precautions    Final diagnoses:  Upper respiratory tract infection, unspecified type    ED Discharge Orders  None          Midge Golas, MD 11/20/23 [phone]

## 2023-11-20 NOTE — ED Triage Notes (Signed)
 Pt reports productive yellow cough, congestion, sore throat, headache, and body aches over the past 3 days. Denies fevers. Last tylenol  dose at 5 pm.   Per note pt is [redacted] weeks pregnant.
